# Patient Record
Sex: Female | Born: 1990 | Race: Black or African American | Hispanic: No | Marital: Single | Smoking: Former smoker
Health system: Southern US, Community
[De-identification: ages and names within clinical notes are randomized; demographics above are authoritative.]

## PROBLEM LIST (undated history)

## (undated) DIAGNOSIS — K219 Gastro-esophageal reflux disease without esophagitis: Secondary | ICD-10-CM

## (undated) DIAGNOSIS — N760 Acute vaginitis: Secondary | ICD-10-CM

## (undated) DIAGNOSIS — N390 Urinary tract infection, site not specified: Secondary | ICD-10-CM

## (undated) DIAGNOSIS — F419 Anxiety disorder, unspecified: Secondary | ICD-10-CM

## (undated) DIAGNOSIS — A749 Chlamydial infection, unspecified: Secondary | ICD-10-CM

## (undated) DIAGNOSIS — A549 Gonococcal infection, unspecified: Secondary | ICD-10-CM

## (undated) DIAGNOSIS — B9689 Other specified bacterial agents as the cause of diseases classified elsewhere: Secondary | ICD-10-CM

## (undated) HISTORY — PX: NO PAST SURGERIES: SHX2092

---
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
## 2012-03-27 ENCOUNTER — Emergency Department (HOSPITAL_COMMUNITY)
Admission: EM | Admit: 2012-03-27 | Discharge: 2012-03-27 | Disposition: A | Discharge status: Home / Self Care | Payer: Medicaid Other | Attending: Emergency Medicine | Admitting: Emergency Medicine

## 2012-03-27 ENCOUNTER — Encounter (HOSPITAL_COMMUNITY): Payer: Self-pay

## 2012-03-27 DIAGNOSIS — R109 Unspecified abdominal pain: Secondary | ICD-10-CM

## 2012-03-27 DIAGNOSIS — R1013 Epigastric pain: Secondary | ICD-10-CM | POA: Insufficient documentation

## 2012-03-27 DIAGNOSIS — R197 Diarrhea, unspecified: Secondary | ICD-10-CM | POA: Insufficient documentation

## 2012-03-27 DIAGNOSIS — Z3202 Encounter for pregnancy test, result negative: Secondary | ICD-10-CM | POA: Insufficient documentation

## 2012-03-27 DIAGNOSIS — F17200 Nicotine dependence, unspecified, uncomplicated: Secondary | ICD-10-CM | POA: Insufficient documentation

## 2012-03-27 DIAGNOSIS — R110 Nausea: Secondary | ICD-10-CM | POA: Insufficient documentation

## 2012-03-27 DIAGNOSIS — Z87448 Personal history of other diseases of urinary system: Secondary | ICD-10-CM | POA: Insufficient documentation

## 2012-03-27 DIAGNOSIS — Z87440 Personal history of urinary (tract) infections: Secondary | ICD-10-CM | POA: Insufficient documentation

## 2012-03-27 LAB — URINALYSIS, MICROSCOPIC ONLY
Bilirubin Urine: NEGATIVE
Glucose, UA: NEGATIVE mg/dL
Hgb urine dipstick: NEGATIVE
Ketones, ur: NEGATIVE mg/dL
Nitrite: NEGATIVE
Protein, ur: NEGATIVE mg/dL
Specific Gravity, Urine: 1.029 (ref 1.005–1.030)
Urobilinogen, UA: 1 mg/dL (ref 0.0–1.0)
pH: 6 (ref 5.0–8.0)

## 2012-03-27 LAB — CBC WITH DIFFERENTIAL/PLATELET
Basophils Absolute: 0 K/uL (ref 0.0–0.1)
Basophils Relative: 0 % (ref 0–1)
Eosinophils Absolute: 0.5 K/uL (ref 0.0–0.7)
Eosinophils Relative: 5 % (ref 0–5)
HCT: 40.3 % (ref 36.0–46.0)
Hemoglobin: 14 g/dL (ref 12.0–15.0)
Lymphocytes Relative: 33 % (ref 12–46)
Lymphs Abs: 3.6 K/uL (ref 0.7–4.0)
MCH: 28.5 pg (ref 26.0–34.0)
MCHC: 34.7 g/dL (ref 30.0–36.0)
MCV: 81.9 fL (ref 78.0–100.0)
Monocytes Absolute: 1.2 10*3/uL — ABNORMAL HIGH (ref 0.1–1.0)
Monocytes Relative: 11 % (ref 3–12)
Neutro Abs: 5.7 10*3/uL (ref 1.7–7.7)
Neutrophils Relative %: 52 % (ref 43–77)
Platelets: 364 K/uL (ref 150–400)
RBC: 4.92 MIL/uL (ref 3.87–5.11)
RDW: 13.9 % (ref 11.5–15.5)
WBC: 10.9 10*3/uL — ABNORMAL HIGH (ref 4.0–10.5)

## 2012-03-27 LAB — COMPREHENSIVE METABOLIC PANEL
AST: 19 U/L (ref 0–37)
BUN: 15 mg/dL (ref 6–23)
CO2: 26 mEq/L (ref 19–32)
Chloride: 102 mEq/L (ref 96–112)
Creatinine, Ser: 0.7 mg/dL (ref 0.50–1.10)
GFR calc non Af Amer: >90 mL/min (ref >90)
Total Bilirubin: 0.2 mg/dL — ABNORMAL LOW (ref 0.3–1.2)

## 2012-03-27 LAB — COMPREHENSIVE METABOLIC PANEL WITH GFR
ALT: 13 U/L (ref 0–35)
Albumin: 4.1 g/dL (ref 3.5–5.2)
Alkaline Phosphatase: 79 U/L (ref 39–117)
Calcium: 10.1 mg/dL (ref 8.4–10.5)
GFR calc Af Amer: >90 mL/min (ref >90)
Glucose, Bld: 85 mg/dL (ref 70–99)
Potassium: 3.9 meq/L (ref 3.5–5.1)
Sodium: 139 meq/L (ref 135–145)
Total Protein: 8.4 g/dL — ABNORMAL HIGH (ref 6.0–8.3)

## 2012-03-27 MED ORDER — PROMETHAZINE HCL 25 MG PO TABS: 25.0000 mg | ORAL_TABLET | Freq: Four times a day (QID) | ORAL | Status: DC | PRN

## 2012-03-27 MED ORDER — PANTOPRAZOLE SODIUM 40 MG PO TBEC: 40.0000 mg | DELAYED_RELEASE_TABLET | Freq: Every day | ORAL | Status: DC

## 2012-03-27 MED ORDER — GI COCKTAIL ~~LOC~~
30.0000 mL | Freq: Once | ORAL | Status: AC
  Administered 2012-03-27: 30 mL via ORAL
  Filled 2012-03-27: qty 30

## 2012-03-27 NOTE — ED Provider Notes (Signed)
History     CSN: [id number]  Arrival date & time 03/27/12  1536   First MD Initiated Contact with Patient 03/27/12 1804      Chief Complaint  Patient presents with  . Abdominal Pain  . Nausea    (Consider location/radiation/quality/duration/timing/severity/associated sxs/prior treatment) HPI Comments: Patient reports that she's had chronic problems with her stomach reporting intermittent upper epigastric pain that is sometimes sharp, sometimes crampy in nature. She reports she's had more frequent episodes and more chronic episodes daily over the last several weeks associated with nausea and occasional loose stool. She denies actual vomiting and no blood in her stool or hematemesis. She denies any dysuria, urinary frequency. She denies any new.neurologic symptoms. She has not taken any medications for the symptoms. She reports she has not taken x4 other antacids. She has seen a family Dr., Dr. Ronne Binning in the past was given some Vicodin for this pain. She denies fever or chills. She denies any previous abdominal surgeries. She does not have a primary care physician but does have Medicaid.  She reports pain is worse across upper epigastrium first thing in the AM.  Gets better through the day.  Not worse with eating.    Patient is a 22 y.o. female presenting with abdominal pain. The history is provided by the patient.  Abdominal Pain Associated symptoms: diarrhea and nausea   Associated symptoms: no chest pain, no chills, no dysuria, no fever, no shortness of breath, no vaginal bleeding, no vaginal discharge and no vomiting     Past Medical History  Diagnosis Date  . UTI (lower urinary tract infection)   . BV (bacterial vaginosis)     History reviewed. No pertinent past surgical history.  History reviewed. No pertinent family history.  History  Substance Use Topics  . Smoking status: Current Every Day Smoker    Types: Cigars  . Smokeless tobacco: Not on file  . Alcohol Use: No     OB History   Grav Para Term Preterm Abortions TAB SAB Ect Mult Living                  Review of Systems  Constitutional: Negative for fever, chills and appetite change.  Respiratory: Negative for shortness of breath.   Cardiovascular: Negative for chest pain.  Gastrointestinal: Positive for nausea, abdominal pain and diarrhea. Negative for vomiting and blood in stool.  Genitourinary: Negative for dysuria, vaginal bleeding and vaginal discharge.  Musculoskeletal: Negative for back pain.  All other systems reviewed and are negative.    Allergies  Pineapple  Home Medications   Current Outpatient Rx  Name  Route  Sig  Dispense  Refill  . pantoprazole (PROTONIX) 40 MG tablet   Oral   Take 1 tablet (40 mg total) by mouth daily.   14 tablet   0   . promethazine (PHENERGAN) 25 MG tablet   Oral   Take 1 tablet (25 mg total) by mouth every 6 (six) hours as needed for nausea.   20 tablet   0     BP 120/79  Pulse 96  Temp(Src) 97.8 F (36.6 C) (Oral)  Resp 18  SpO2 99%  LMP 03/11/2012  Physical Exam  Nursing note and vitals reviewed. Constitutional: She is oriented to person, place, and time. She appears well-developed and well-nourished.  HENT:  Head: Normocephalic and atraumatic.  Eyes: Pupils are equal, round, and reactive to light. No scleral icterus.  Neck: Normal range of motion. Neck supple.  Cardiovascular: Normal  rate and intact distal pulses.   Pulmonary/Chest: Effort normal. No respiratory distress. She has no wheezes.  Abdominal: Soft. She exhibits no distension. There is no tenderness. There is no rebound and no guarding.  Neurological: She is alert and oriented to person, place, and time. Coordination and gait normal.  Skin: Skin is warm and dry.  Psychiatric: She has a normal mood and affect.    ED Course  Procedures (including critical care time)  Labs Reviewed  CBC WITH DIFFERENTIAL - Abnormal; Notable for the following:    WBC 10.9 (*)     Monocytes Absolute 1.2 (*)    All other components within normal limits  COMPREHENSIVE METABOLIC PANEL - Abnormal; Notable for the following:    Total Protein 8.4 (*)    Total Bilirubin 0.2 (*)    All other components within normal limits  URINALYSIS, MICROSCOPIC ONLY - Abnormal; Notable for the following:    Leukocytes, UA SMALL (*)    Bacteria, UA FEW (*)    Squamous Epithelial / LPF MANY (*)    All other components within normal limits  URINE CULTURE   No results found.   1. Abdominal pain     ra sat is 98% and I interpret to be normal.    6:59 PM Urine hCG Medicare test was reportedly negative. It is crossing over an electronic system.  MDM  Pt with soft abdomen, no surgical signs.  Pt's vitals are normal.  Pt's history suggests more of a chronic issue and needs outpt referrals to PCP and/or GI.  Will put on protonix.          Gavin Pound. Oletta Lamas, MD 03/27/12 1859

## 2012-03-27 NOTE — ED Notes (Signed)
Pt here for general abd pain and nausea and diarrhea. Denies any vomiting. States this has been bothering her for 2-3 weeks. Last episode of diarrhea was 2 days ago. Has been eating and drinking okay except for today. Reports decrease in appetite today. Denies any vaginal bleeding or discharge or urinary symptoms.

## 2012-03-27 NOTE — Discharge Instructions (Signed)
Abdominal Pain  Abdominal pain can be caused by many things. Your caregiver decides the seriousness of your pain by an examination and possibly blood tests and X-rays. Many cases can be observed and treated at home. Most abdominal pain is not caused by a disease and will probably improve without treatment. However, in many cases, more time must pass before a clear cause of the pain can be found. Before that point, it may not be known if you need more testing, or if hospitalization or surgery is needed.  HOME CARE INSTRUCTIONS   · Do not take laxatives unless directed by your caregiver.  · Take pain medicine only as directed by your caregiver.  · Only take over-the-counter or prescription medicines for pain, discomfort, or fever as directed by your caregiver.  · Try a clear liquid diet (broth, tea, or water) for as long as directed by your caregiver. Slowly move to a bland diet as tolerated.  SEEK IMMEDIATE MEDICAL CARE IF:   · The pain does not go away.  · You have a fever.  · You keep throwing up (vomiting).  · The pain is felt only in portions of the abdomen. Pain in the right side could possibly be appendicitis. In an adult, pain in the left lower portion of the abdomen could be colitis or diverticulitis.  · You pass bloody or black tarry stools.  MAKE SURE YOU:   · Understand these instructions.  · Will watch your condition.  · Will get help right away if you are not doing well or get worse.  Document Released: 10/18/2004 Document Revised: 04/02/2011 Document Reviewed: 08/27/2007  [employer]® Patient Information ©2013 [employer].

## 2012-03-28 LAB — POCT PREGNANCY, URINE: Preg Test, Ur: NEGATIVE

## 2012-03-29 LAB — URINE CULTURE: Colony Count: 35000

## 2012-06-03 ENCOUNTER — Inpatient Hospital Stay (HOSPITAL_COMMUNITY)
Admission: AD | Admit: 2012-06-03 | Discharge: 2012-06-03 | Disposition: A | Discharge status: Home / Self Care | Payer: Medicaid Other | Source: Ambulatory Visit | Attending: Obstetrics & Gynecology | Admitting: Obstetrics & Gynecology

## 2012-06-03 ENCOUNTER — Encounter (HOSPITAL_COMMUNITY): Payer: Self-pay | Admitting: *Deleted

## 2012-06-03 DIAGNOSIS — B3731 Acute candidiasis of vulva and vagina: Secondary | ICD-10-CM

## 2012-06-03 DIAGNOSIS — N912 Amenorrhea, unspecified: Secondary | ICD-10-CM | POA: Insufficient documentation

## 2012-06-03 DIAGNOSIS — B373 Candidiasis of vulva and vagina: Secondary | ICD-10-CM

## 2012-06-03 HISTORY — DX: Gastro-esophageal reflux disease without esophagitis: K21.9

## 2012-06-03 LAB — URINALYSIS, ROUTINE W REFLEX MICROSCOPIC
Bilirubin Urine: NEGATIVE
Glucose, UA: NEGATIVE mg/dL
Hgb urine dipstick: NEGATIVE
Ketones, ur: NEGATIVE mg/dL
Protein, ur: NEGATIVE mg/dL

## 2012-06-03 LAB — URINE MICROSCOPIC-ADD ON

## 2012-06-03 LAB — WET PREP, GENITAL: Trich, Wet Prep: NONE SEEN

## 2012-06-03 MED ORDER — FLUCONAZOLE 150 MG PO TABS: 150.0000 mg | ORAL_TABLET | Freq: Once | ORAL | Status: DC

## 2012-06-03 MED ORDER — IBUPROFEN 400 MG PO TABS
400.0000 mg | ORAL_TABLET | Freq: Once | ORAL | Status: AC
  Administered 2012-06-03: 400 mg via ORAL
  Filled 2012-06-03: qty 1

## 2012-06-03 NOTE — MAU Note (Signed)
Think I am pregnant, has not done a home test

## 2012-06-03 NOTE — MAU Provider Note (Signed)
History     CSN: [id number]  Arrival date and time: 06/03/12 1254   None     No chief complaint on file.  HPI Pt is not pregnant but missed her period and concerned that she might be pregnant. Her LMP was 04/15/2012. Pt denies spotting or bleeding or vaginal discharge.  Pt has hx of irregular periods.  Past Medical History  Diagnosis Date  . UTI (lower urinary tract infection)   . BV (bacterial vaginosis)   . GERD (gastroesophageal reflux disease)     Past Surgical History  Procedure Laterality Date  . No past surgeries      History reviewed. No pertinent family history.  History  Substance Use Topics  . Smoking status: Current Every Day Smoker    Types: Cigars  . Smokeless tobacco: Not on file  . Alcohol Use: No    Allergies:  Allergies  Allergen Reactions  . Pineapple Swelling    Tongue swelling    No prescriptions prior to admission    ROS Physical Exam   Blood pressure 117/81, pulse 79, temperature 98.1 F (36.7 C), temperature source Oral, resp. rate 18, height 5' 2.5" (1.588 m), weight 78.472 kg (173 lb), last menstrual period 04/15/2012.  Physical Exam  Nursing note and vitals reviewed. Constitutional: She is oriented to person, place, and time. She appears well-developed and well-nourished.  HENT:  Head: Normocephalic.  Eyes: Pupils are equal, round, and reactive to light.  Neck: Normal range of motion. Neck supple.  Cardiovascular: Normal rate.   Respiratory: Effort normal.  GI: Soft. She exhibits no distension. There is no tenderness. There is no rebound and no guarding.  Genitourinary:  Vaginal mucosa reddened; mod amount of thick curd type white discharge in vault; cervix clean, NT; uterus NSSC NT; adnexal without palpable enlargement or tenderness  Musculoskeletal: Normal range of motion.  Neurological: She is alert and oriented to person, place, and time.  Skin: Skin is warm and dry.  Psychiatric: She has a normal mood and affect.     MAU Course  Procedures Results for orders placed during the hospital encounter of 06/03/12 (from the past 24 hour(s))  URINALYSIS, ROUTINE W REFLEX MICROSCOPIC     Status: Abnormal   Collection Time    06/03/12  1:30 PM      Result Value Range   Color, Urine YELLOW  YELLOW   APPearance CLEAR  CLEAR   Specific Gravity, Urine >1.030 (*) 1.005 - 1.030   pH 6.0  5.0 - 8.0   Glucose, UA NEGATIVE  NEGATIVE mg/dL   Hgb urine dipstick NEGATIVE  NEGATIVE   Bilirubin Urine NEGATIVE  NEGATIVE   Ketones, ur NEGATIVE  NEGATIVE mg/dL   Protein, ur NEGATIVE  NEGATIVE mg/dL   Urobilinogen, UA 0.2  0.0 - 1.0 mg/dL   Nitrite NEGATIVE  NEGATIVE   Leukocytes, UA TRACE (*) NEGATIVE  URINE MICROSCOPIC-ADD ON     Status: None   Collection Time    06/03/12  1:30 PM      Result Value Range   Squamous Epithelial / LPF RARE  RARE   WBC, UA 0-2  <3 WBC/hpf   RBC / HPF 0-2  <3 RBC/hpf   Bacteria, UA RARE  RARE   Urine-Other MUCOUS PRESENT    POCT PREGNANCY, URINE     Status: None   Collection Time    06/03/12  1:39 PM      Result Value Range   Preg Test, Ur NEGATIVE  NEGATIVE  WET PREP, GENITAL     Status: Abnormal   Collection Time    06/03/12  3:10 PM      Result Value Range   Yeast Wet Prep HPF POC MODERATE (*) NONE SEEN   Trich, Wet Prep NONE SEEN  NONE SEEN   Clue Cells Wet Prep HPF POC NONE SEEN  NONE SEEN   WBC, Wet Prep HPF POC MODERATE (*) NONE SEEN    Assessment and Plan  Yeast vaginitis- Diflucan 150#2 one po now and repeat 72 hours Amenorrhea- f/u in GYN clinic for persistant amenorrhea  Ger Ringenberg 06/03/2012, 3:42 PM

## 2012-06-09 ENCOUNTER — Ambulatory Visit (INDEPENDENT_AMBULATORY_CARE_PROVIDER_SITE_OTHER): Payer: Medicaid Other | Admitting: General Practice

## 2012-06-09 VITALS — BP 128/77 | HR 76 | Temp 97.8°F | Ht 62.0 in | Wt 171.2 lb

## 2012-06-09 DIAGNOSIS — A749 Chlamydial infection, unspecified: Secondary | ICD-10-CM

## 2012-06-09 DIAGNOSIS — A549 Gonococcal infection, unspecified: Secondary | ICD-10-CM

## 2012-06-09 DIAGNOSIS — A5400 Gonococcal infection of lower genitourinary tract, unspecified: Secondary | ICD-10-CM

## 2012-06-09 MED ORDER — CEFTRIAXONE SODIUM 1 G IJ SOLR
250.0000 mg | Freq: Once | INTRAMUSCULAR | Status: AC
  Administered 2012-06-09: 250 mg via INTRAMUSCULAR

## 2012-06-09 MED ORDER — AZITHROMYCIN 1 G PO PACK
1.0000 g | PACK | Freq: Once | ORAL | Status: AC
  Administered 2012-06-09: 1 g via ORAL

## 2012-09-01 ENCOUNTER — Encounter (HOSPITAL_COMMUNITY): Payer: Self-pay | Admitting: *Deleted

## 2012-09-01 ENCOUNTER — Emergency Department (HOSPITAL_COMMUNITY)
Admission: EM | Admit: 2012-09-01 | Discharge: 2012-09-01 | Disposition: A | Discharge status: Home / Self Care | Payer: Medicaid Other | Attending: Emergency Medicine | Admitting: Emergency Medicine

## 2012-09-01 DIAGNOSIS — R197 Diarrhea, unspecified: Secondary | ICD-10-CM | POA: Insufficient documentation

## 2012-09-01 DIAGNOSIS — Z8719 Personal history of other diseases of the digestive system: Secondary | ICD-10-CM | POA: Insufficient documentation

## 2012-09-01 DIAGNOSIS — R109 Unspecified abdominal pain: Secondary | ICD-10-CM | POA: Insufficient documentation

## 2012-09-01 DIAGNOSIS — F17200 Nicotine dependence, unspecified, uncomplicated: Secondary | ICD-10-CM | POA: Insufficient documentation

## 2012-09-01 DIAGNOSIS — R110 Nausea: Secondary | ICD-10-CM | POA: Insufficient documentation

## 2012-09-01 DIAGNOSIS — N949 Unspecified condition associated with female genital organs and menstrual cycle: Secondary | ICD-10-CM | POA: Insufficient documentation

## 2012-09-01 DIAGNOSIS — A499 Bacterial infection, unspecified: Secondary | ICD-10-CM | POA: Insufficient documentation

## 2012-09-01 DIAGNOSIS — B9689 Other specified bacterial agents as the cause of diseases classified elsewhere: Secondary | ICD-10-CM | POA: Insufficient documentation

## 2012-09-01 DIAGNOSIS — Z3202 Encounter for pregnancy test, result negative: Secondary | ICD-10-CM | POA: Insufficient documentation

## 2012-09-01 DIAGNOSIS — N390 Urinary tract infection, site not specified: Secondary | ICD-10-CM | POA: Insufficient documentation

## 2012-09-01 DIAGNOSIS — Z792 Long term (current) use of antibiotics: Secondary | ICD-10-CM | POA: Insufficient documentation

## 2012-09-01 DIAGNOSIS — N898 Other specified noninflammatory disorders of vagina: Secondary | ICD-10-CM | POA: Insufficient documentation

## 2012-09-01 DIAGNOSIS — N760 Acute vaginitis: Secondary | ICD-10-CM | POA: Insufficient documentation

## 2012-09-01 LAB — URINE MICROSCOPIC-ADD ON

## 2012-09-01 LAB — URINALYSIS, ROUTINE W REFLEX MICROSCOPIC
Bilirubin Urine: NEGATIVE
Specific Gravity, Urine: 1.027 (ref 1.005–1.030)
pH: 8 (ref 5.0–8.0)

## 2012-09-01 LAB — WET PREP, GENITAL
Trich, Wet Prep: NONE SEEN
Yeast Wet Prep HPF POC: NONE SEEN

## 2012-09-01 LAB — POCT PREGNANCY, URINE: Preg Test, Ur: NEGATIVE

## 2012-09-01 MED ORDER — METRONIDAZOLE 500 MG PO TABS: 500.0000 mg | ORAL_TABLET | Freq: Two times a day (BID) | ORAL | Status: DC

## 2012-09-01 MED ORDER — CIPROFLOXACIN HCL 500 MG PO TABS: 500.0000 mg | ORAL_TABLET | Freq: Two times a day (BID) | ORAL | Status: DC

## 2012-09-01 NOTE — ED Notes (Signed)
Patient reports "I think I have a UTI" c/o dysuria x 1 day that radiates to her back. Also has some mild abd pain x 1 month after starting Depo injections. LMP March. Cycles are irregular. Denies hematuria. No vaginal discharge.

## 2012-09-01 NOTE — ED Provider Notes (Signed)
CSN: [id number]     Arrival date & time 09/01/12  1401 History     First MD Initiated Contact with Patient 09/01/12 1517     Chief Complaint  Patient presents with  . Dysuria   (Consider location/radiation/quality/duration/timing/severity/associated sxs/prior Treatment) HPI Pt is a 22yo female with hx of UTI presenting with dysuria that started yesterday associated with abdominal pain described as sharp and cramping, radiation to her back, 7/10.  Has not tried anything for pain.  Reports some nausea and 3 episodes of diarrhea without blood or mucous.  Pt states she feels like this every time she has a UTI.  Denies fever.  Denies vaginal discharge but does report some lower abdominal and vaginal pain.  Denies previous abdominal surgeries or kidney problems. Last UTI was 2-81mo ago.  Pt states she started Depo last month but has not had a menstrual cycle since March 2014.  Pt states she is not concerned for STDs but still would like pelvic exam.    Past Medical History  Diagnosis Date  . UTI (lower urinary tract infection)   . BV (bacterial vaginosis)   . GERD (gastroesophageal reflux disease)    Past Surgical History  Procedure Laterality Date  . No past surgeries     No family history on file. History  Substance Use Topics  . Smoking status: Current Every Day Smoker    Types: Cigars  . Smokeless tobacco: Not on file  . Alcohol Use: No   OB History   Grav Para Term Preterm Abortions TAB SAB Ect Mult Living   0              Review of Systems  Constitutional: Negative for fever and chills.  Gastrointestinal: Positive for nausea, abdominal pain and diarrhea. Negative for vomiting.  Genitourinary: Positive for dysuria, urgency, frequency, flank pain, vaginal pain and pelvic pain. Negative for vaginal bleeding and vaginal discharge.  All other systems reviewed and are negative.    Allergies  Pineapple  Home Medications   Current Outpatient Rx  Name  Route  Sig  Dispense   Refill  . ciprofloxacin (CIPRO) 500 MG tablet   Oral   Take 1 tablet (500 mg total) by mouth 2 (two) times daily.   6 tablet   0   . metroNIDAZOLE (FLAGYL) 500 MG tablet   Oral   Take 1 tablet (500 mg total) by mouth 2 (two) times daily.   14 tablet   0    BP 111/73  Pulse 74  Temp(Src) 99.3 F (37.4 C) (Oral)  Resp 14  SpO2 100%  LMP 04/01/2012 Physical Exam  Nursing note and vitals reviewed. Constitutional: She appears well-developed and well-nourished. No distress.  Pt sitting comfortably in exam bed, NAD.     HENT:  Head: Normocephalic and atraumatic.  Eyes: Conjunctivae are normal. No scleral icterus.  Neck: Normal range of motion.  Cardiovascular: Normal rate, regular rhythm and normal heart sounds.   Pulmonary/Chest: Effort normal and breath sounds normal. No respiratory distress. She has no wheezes. She has no rales. She exhibits no tenderness.  Abdominal: Soft. Bowel sounds are normal. She exhibits no distension and no mass. There is tenderness ( mild, lower abdomen ). There is no rebound, no guarding and no CVA tenderness.  No CVAT  Genitourinary: Vagina normal and uterus normal. No labial fusion. There is no rash, tenderness, lesion or injury on the right labia. There is no rash, tenderness, lesion or injury on the left labia. Cervix  exhibits discharge (scant clear to yellow discharge ). Cervix exhibits no motion tenderness and no friability. Right adnexum displays no mass, no tenderness and no fullness. Left adnexum displays no mass, no tenderness and no fullness. No erythema, tenderness or bleeding around the vagina. No foreign body around the vagina. No signs of injury around the vagina. No vaginal discharge found.  Musculoskeletal: Normal range of motion.  Neurological: She is alert.  Skin: Skin is warm and dry. She is not diaphoretic.    ED Course   Procedures (including critical care time)  Labs Reviewed  WET PREP, GENITAL - Abnormal; Notable for the  following:    Clue Cells Wet Prep HPF POC FEW (*)    WBC, Wet Prep HPF POC MODERATE (*)    All other components within normal limits  URINALYSIS, ROUTINE W REFLEX MICROSCOPIC - Abnormal; Notable for the following:    APPearance CLOUDY (*)    Hgb urine dipstick LARGE (*)    Protein, ur 100 (*)    Leukocytes, UA LARGE (*)    All other components within normal limits  URINE MICROSCOPIC-ADD ON - Abnormal; Notable for the following:    Squamous Epithelial / LPF MANY (*)    Bacteria, UA FEW (*)    All other components within normal limits  URINE CULTURE  GC/CHLAMYDIA PROBE AMP  POCT PREGNANCY, URINE   No results found. 1. UTI (lower urinary tract infection)   2. BV (bacterial vaginosis)     MDM  Pt likely has UTI due to PMH and symptoms.  Will also perform pelvic exam.  Pt is afebrile and appears well at this time.  Not concerned for pyelonephritis at this time.    UA: evidence of UTI Wet prep: few clue cells, will tx  Rx: cirpo and flagyl. Will discharge pt home and have her f/u with [facility] info provided. Return precautions given. Pt verbalized understanding and agreement with tx plan. Vitals: unremarkable. Discharged in stable condition.         Junius Finner, PA-C 09/01/12 1734

## 2012-09-01 NOTE — ED Notes (Signed)
PA at bedside.

## 2012-09-01 NOTE — ED Notes (Signed)
Pt with hx of frequent UTI's to ED c/o stinging with urination and diffuse abd pain and back pain. Denies vaginal discharge.

## 2012-09-02 LAB — GC/CHLAMYDIA PROBE AMP
CT Probe RNA: NEGATIVE
GC Probe RNA: NEGATIVE

## 2012-09-02 LAB — URINE CULTURE

## 2012-09-05 NOTE — ED Provider Notes (Signed)
Medical screening examination/treatment/procedure(s) were performed by non-physician practitioner and as supervising physician I was immediately available for consultation/collaboration.   Katherine Doane Joseph Charlynn Salih, MD 09/05/12 1051 

## 2012-10-29 ENCOUNTER — Emergency Department (HOSPITAL_COMMUNITY)
Admission: EM | Admit: 2012-10-29 | Discharge: 2012-10-30 | Disposition: A | Discharge status: Home / Self Care | Payer: Medicaid Other | Attending: Emergency Medicine | Admitting: Emergency Medicine

## 2012-10-29 ENCOUNTER — Encounter (HOSPITAL_COMMUNITY): Payer: Self-pay | Admitting: Emergency Medicine

## 2012-10-29 DIAGNOSIS — Z8719 Personal history of other diseases of the digestive system: Secondary | ICD-10-CM | POA: Insufficient documentation

## 2012-10-29 DIAGNOSIS — Z3202 Encounter for pregnancy test, result negative: Secondary | ICD-10-CM | POA: Insufficient documentation

## 2012-10-29 DIAGNOSIS — N390 Urinary tract infection, site not specified: Secondary | ICD-10-CM | POA: Insufficient documentation

## 2012-10-29 DIAGNOSIS — F17200 Nicotine dependence, unspecified, uncomplicated: Secondary | ICD-10-CM | POA: Insufficient documentation

## 2012-10-29 DIAGNOSIS — A499 Bacterial infection, unspecified: Secondary | ICD-10-CM | POA: Insufficient documentation

## 2012-10-29 DIAGNOSIS — N760 Acute vaginitis: Secondary | ICD-10-CM | POA: Insufficient documentation

## 2012-10-29 DIAGNOSIS — B9689 Other specified bacterial agents as the cause of diseases classified elsewhere: Secondary | ICD-10-CM | POA: Insufficient documentation

## 2012-10-29 LAB — URINE MICROSCOPIC-ADD ON

## 2012-10-29 LAB — URINALYSIS, ROUTINE W REFLEX MICROSCOPIC
Ketones, ur: 15 mg/dL — AB
Nitrite: POSITIVE — AB
Protein, ur: >300 mg/dL — AB
pH: 5 (ref 5.0–8.0)

## 2012-10-29 NOTE — ED Notes (Addendum)
Wet prep and GC/Chlamydia collected by Magnus Sinning, PA

## 2012-10-29 NOTE — ED Provider Notes (Signed)
CSN: [id number]     Arrival date & time 10/29/12  2016 History   First MD Initiated Contact with Patient 10/29/12 2155     Chief Complaint  Patient presents with  . Dysuria  . Hematuria   (Consider location/radiation/quality/duration/timing/severity/associated sxs/prior Treatment) HPI Comments: Patient presents today with a chief complaint of dysuria and hematuria.  She reports that her symptoms began earlier today and have gradually been worsening.  She also reports increased urinary frequency and urgency.  She has not taken anything for her symptoms prior to arrival.  She denies flank pain.  Denies fever or chills.  Denies nausea or vomiting.  She is also requesting to be tested for STD's.  She is currently sexually active.  She denies vaginal discharge.  LMP was three months ago.  She is currently on Depo Provera.    The history is provided by the patient.    Past Medical History  Diagnosis Date  . UTI (lower urinary tract infection)   . BV (bacterial vaginosis)   . GERD (gastroesophageal reflux disease)    Past Surgical History  Procedure Laterality Date  . No past surgeries     No family history on file. History  Substance Use Topics  . Smoking status: Current Every Day Smoker    Types: Cigars  . Smokeless tobacco: Not on file  . Alcohol Use: No   OB History   Grav Para Term Preterm Abortions TAB SAB Ect Mult Living   0              Review of Systems  All other systems reviewed and are negative.    Allergies  Pineapple  Home Medications  No current outpatient prescriptions on file. BP 106/62  Pulse 77  Temp(Src) 98.3 F (36.8 C) (Oral)  Resp 20  Wt 177 lb 9 oz (80.542 kg)  BMI 32.47 kg/m2  SpO2 100% Physical Exam  Nursing note and vitals reviewed. Constitutional: She appears well-developed and well-nourished.  HENT:  Head: Normocephalic and atraumatic.  Mouth/Throat: Oropharynx is clear and moist.  Neck: Normal range of motion. Neck supple.   Cardiovascular: Normal rate, regular rhythm and normal heart sounds.   Pulmonary/Chest: Effort normal and breath sounds normal.  Abdominal: Soft. Bowel sounds are normal. She exhibits no distension and no mass. There is no rebound, no guarding and no CVA tenderness.  Mild suprapubic abdominal pain  Genitourinary: Vagina normal. Cervix exhibits no motion tenderness. Right adnexum displays no mass, no tenderness and no fullness. Left adnexum displays no mass, no tenderness and no fullness.  Thick whitish colored discharge in the vaginal vault  Musculoskeletal: Normal range of motion.  Neurological: She is alert.  Skin: Skin is warm and dry.  Psychiatric: She has a normal mood and affect.    ED Course  Procedures (including critical care time) Labs Review Labs Reviewed  URINALYSIS, ROUTINE W REFLEX MICROSCOPIC - Abnormal; Notable for the following:    Color, Urine RED (*)    APPearance TURBID (*)    Hgb urine dipstick LARGE (*)    Bilirubin Urine MODERATE (*)    Ketones, ur 15 (*)    Protein, ur >300 (*)    Nitrite POSITIVE (*)    Leukocytes, UA LARGE (*)    All other components within normal limits  URINE MICROSCOPIC-ADD ON - Abnormal; Notable for the following:    Squamous Epithelial / LPF FEW (*)    Bacteria, UA FEW (*)    All other components within normal  limits  URINE CULTURE  GC/CHLAMYDIA PROBE AMP  WET PREP, GENITAL  POCT PREGNANCY, URINE   Imaging Review No results found.  MDM  No diagnosis found. Patient presenting with dysuria and hematuria.  UA showing UTI.  Patient denies fever, chills, nausea, vomiting, or flank pain.  No CVA tenderness on exam.  Therefore, doubt kidney stone or Pyelonephritis.  Patient treated with Macrobid.  Urine cultured.  Patient also requesting testing for STD's.  Wet prep showing BV.  Patient started on Flagyl.  GC/Chlamydia pending.  Patient stable for discharge.    Pascal Lux [place], PA-C 10/30/12 1223

## 2012-10-29 NOTE — ED Notes (Signed)
Pt. reports dysuria /hematuria onset this morning , denies injury , no fever or chills , slight vaginal/low abdominal cramping.

## 2012-10-30 LAB — WET PREP, GENITAL: Yeast Wet Prep HPF POC: NONE SEEN

## 2012-10-30 LAB — URINE CULTURE: Colony Count: >=100000

## 2012-10-30 MED ORDER — NITROFURANTOIN MONOHYD MACRO 100 MG PO CAPS: 100.0000 mg | ORAL_CAPSULE | Freq: Two times a day (BID) | ORAL | Status: DC

## 2012-10-30 MED ORDER — METRONIDAZOLE 500 MG PO TABS: 500.0000 mg | ORAL_TABLET | Freq: Two times a day (BID) | ORAL | Status: DC

## 2012-10-31 LAB — GC/CHLAMYDIA PROBE AMP: GC Probe RNA: POSITIVE — AB

## 2012-10-31 NOTE — ED Provider Notes (Signed)
Medical screening examination/treatment/procedure(s) were performed by non-physician practitioner and as supervising physician I was immediately available for consultation/collaboration.   Charles B. Sheldon, MD 10/31/12 2028 

## 2012-11-01 ENCOUNTER — Telehealth (HOSPITAL_COMMUNITY): Payer: Self-pay | Admitting: Emergency Medicine

## 2012-11-01 NOTE — ED Notes (Signed)
+  Chlamydia. +Gonorrhea. Chart sent to EDP office for review. 

## 2012-11-01 NOTE — ED Notes (Signed)
Patient has +Chlamydia and +Gonorrhea. °

## 2012-11-06 NOTE — ED Notes (Signed)
Chart returned from EDP office  Azithromycin 1000 mg po x1 Cefpodoxime 400 mg po x 1 Inform patient,should f/u with Health Dept.

## 2012-11-08 ENCOUNTER — Telehealth (HOSPITAL_COMMUNITY): Payer: Self-pay | Admitting: Emergency Medicine

## 2012-11-12 NOTE — ED Notes (Signed)
Patient informed of positive results after id'd x 2 and informed of need to notify partner to be treated.rx called to pharmacy by Marisue Ivan PFM

## 2012-11-25 ENCOUNTER — Emergency Department (HOSPITAL_COMMUNITY)
Admission: EM | Admit: 2012-11-25 | Discharge: 2012-11-26 | Disposition: A | Discharge status: Home / Self Care | Payer: Medicaid Other | Attending: Emergency Medicine | Admitting: Emergency Medicine

## 2012-11-25 ENCOUNTER — Encounter (HOSPITAL_COMMUNITY): Payer: Self-pay | Admitting: Emergency Medicine

## 2012-11-25 DIAGNOSIS — F17200 Nicotine dependence, unspecified, uncomplicated: Secondary | ICD-10-CM | POA: Insufficient documentation

## 2012-11-25 DIAGNOSIS — Z3202 Encounter for pregnancy test, result negative: Secondary | ICD-10-CM | POA: Insufficient documentation

## 2012-11-25 DIAGNOSIS — A499 Bacterial infection, unspecified: Secondary | ICD-10-CM | POA: Insufficient documentation

## 2012-11-25 DIAGNOSIS — N760 Acute vaginitis: Secondary | ICD-10-CM | POA: Insufficient documentation

## 2012-11-25 DIAGNOSIS — Z87440 Personal history of urinary (tract) infections: Secondary | ICD-10-CM | POA: Insufficient documentation

## 2012-11-25 DIAGNOSIS — B9689 Other specified bacterial agents as the cause of diseases classified elsewhere: Secondary | ICD-10-CM | POA: Insufficient documentation

## 2012-11-25 DIAGNOSIS — Z202 Contact with and (suspected) exposure to infections with a predominantly sexual mode of transmission: Secondary | ICD-10-CM | POA: Insufficient documentation

## 2012-11-25 DIAGNOSIS — Z8719 Personal history of other diseases of the digestive system: Secondary | ICD-10-CM | POA: Insufficient documentation

## 2012-11-25 MED ORDER — PENICILLIN G BENZATHINE 1200000 UNIT/2ML IM SUSP
1.2000 10*6.[IU] | Freq: Once | INTRAMUSCULAR | Status: AC
  Administered 2012-11-26: 1.2 10*6.[IU] via INTRAMUSCULAR
  Filled 2012-11-25: qty 2

## 2012-11-25 NOTE — ED Notes (Signed)
Pt. advised by health dept. to have a STD check due to sexual encounter with an individual positive for STD . Denies any urinary or genital symptoms or discomfort .

## 2012-11-25 NOTE — ED Notes (Signed)
MD at bedside. 

## 2012-11-25 NOTE — ED Notes (Signed)
Patient is resting comfortably. 

## 2012-11-25 NOTE — ED Provider Notes (Signed)
CSN: [id number]     Arrival date & time 11/25/12  2117 History   First MD Initiated Contact with Patient 11/25/12 2347     Chief Complaint  Patient presents with  . Exposure to STD   (Consider location/radiation/quality/duration/timing/severity/associated sxs/prior Treatment) HPI Comments: Patient is a 22 year old female with a past medical history of gonorrhea and chlamydia who presents to the emergency department requesting to be checked for sexually transmitted diseases. Patient states her partner was recently told he tested positive for syphilis and was advised to go for treatment. Currently patient is without any complaints, denies abdominal pain, fever, chills, vaginal bleeding, discharge or pain. Admits to being sexually active with a new partner, states she uses protection and practices oral sex. She was recently treated for both gonorrhea and Chlamydia. LMP June or July, recently came off birth control.  Patient is a 22 y.o. female presenting with STD exposure. The history is provided by the patient.  Exposure to STD    Past Medical History  Diagnosis Date  . UTI (lower urinary tract infection)   . BV (bacterial vaginosis)   . GERD (gastroesophageal reflux disease)    Past Surgical History  Procedure Laterality Date  . No past surgeries     No family history on file. History  Substance Use Topics  . Smoking status: Current Every Day Smoker    Types: Cigars  . Smokeless tobacco: Not on file  . Alcohol Use: No   OB History   Grav Para Term Preterm Abortions TAB SAB Ect Mult Living   0              Review of Systems  All other systems reviewed and are negative.    Allergies  Pineapple  Home Medications  No current outpatient prescriptions on file. BP 126/81  Pulse 91  Temp(Src) 97.4 F (36.3 C) (Oral)  Resp 18  SpO2 99% Physical Exam  Nursing note and vitals reviewed. Constitutional: She is oriented to person, place, and time. She appears well-developed  and well-nourished. No distress.  HENT:  Head: Normocephalic and atraumatic.  Mouth/Throat: Oropharynx is clear and moist.  Eyes: Conjunctivae and EOM are normal.  Neck: Normal range of motion. Neck supple.  Cardiovascular: Normal rate, regular rhythm and normal heart sounds.   Pulmonary/Chest: Effort normal and breath sounds normal.  Abdominal: Soft. Bowel sounds are normal. There is no tenderness.  Genitourinary: Uterus normal. Cervix exhibits no motion tenderness, no discharge and no friability. Right adnexum displays no mass, no tenderness and no fullness. Left adnexum displays no mass, no tenderness and no fullness. No erythema, tenderness or bleeding around the vagina. Vaginal discharge (malodorous, white) found.  Musculoskeletal: Normal range of motion. She exhibits no edema.  Neurological: She is alert and oriented to person, place, and time.  Skin: Skin is warm and dry. She is not diaphoretic.  Psychiatric: She has a normal mood and affect. Her behavior is normal.    ED Course  Procedures (including critical care time) Labs Review Labs Reviewed  WET PREP, GENITAL - Abnormal; Notable for the following:    Clue Cells Wet Prep HPF POC FEW (*)    WBC, Wet Prep HPF POC MANY (*)    All other components within normal limits  GC/CHLAMYDIA PROBE AMP  RPR  HIV ANTIBODY (ROUTINE TESTING)  POCT PREGNANCY, URINE   Imaging Review No results found.  EKG Interpretation   None       MDM   1. Exposure to STD  2. BV (bacterial vaginosis)    Flagyl for BV. 1.2 M units PCN IM given for exposure to syphilis. RPR, HIV pending. GC/chlamydia pending. Recently treated for gc/chlamydia. No CMT. Asymptomatic. Infection care and precautions discussed, return precautions discussed. Patient states understanding of treatment care plan and is agreeable.    Trevor Mace, PA-C 11/26/12 [phone]

## 2012-11-25 NOTE — ED Notes (Signed)
Pt c/o needing to be checked out for syphilis, Pt partner stated that she needed to be checked out for this disease, Pt is Asymptomatic, but just checking out to be sure

## 2012-11-26 LAB — WET PREP, GENITAL: Yeast Wet Prep HPF POC: NONE SEEN

## 2012-11-26 LAB — HIV ANTIBODY (ROUTINE TESTING W REFLEX): HIV: NONREACTIVE

## 2012-11-26 LAB — RPR: RPR Ser Ql: NONREACTIVE

## 2012-11-26 MED ORDER — METRONIDAZOLE 500 MG PO TABS: 500.0000 mg | ORAL_TABLET | Freq: Two times a day (BID) | ORAL | Status: DC

## 2012-11-26 NOTE — ED Provider Notes (Signed)
Medical screening examination/treatment/procedure(s) were performed by non-physician practitioner and as supervising physician I was immediately available for consultation/collaboration.   Nesbit Michon, MD 11/26/12 0625 

## 2013-02-19 ENCOUNTER — Emergency Department (HOSPITAL_COMMUNITY)
Admission: EM | Admit: 2013-02-19 | Discharge: 2013-02-19 | Disposition: A | Discharge status: Home / Self Care | Payer: Medicaid Other | Attending: Emergency Medicine | Admitting: Emergency Medicine

## 2013-02-19 ENCOUNTER — Encounter (HOSPITAL_COMMUNITY): Payer: Self-pay | Admitting: Emergency Medicine

## 2013-02-19 DIAGNOSIS — Z3202 Encounter for pregnancy test, result negative: Secondary | ICD-10-CM | POA: Insufficient documentation

## 2013-02-19 DIAGNOSIS — R110 Nausea: Secondary | ICD-10-CM | POA: Insufficient documentation

## 2013-02-19 DIAGNOSIS — A5909 Other urogenital trichomoniasis: Secondary | ICD-10-CM | POA: Insufficient documentation

## 2013-02-19 DIAGNOSIS — R197 Diarrhea, unspecified: Secondary | ICD-10-CM | POA: Insufficient documentation

## 2013-02-19 DIAGNOSIS — N72 Inflammatory disease of cervix uteri: Secondary | ICD-10-CM

## 2013-02-19 DIAGNOSIS — Z8719 Personal history of other diseases of the digestive system: Secondary | ICD-10-CM | POA: Insufficient documentation

## 2013-02-19 DIAGNOSIS — F17200 Nicotine dependence, unspecified, uncomplicated: Secondary | ICD-10-CM | POA: Insufficient documentation

## 2013-02-19 DIAGNOSIS — N390 Urinary tract infection, site not specified: Secondary | ICD-10-CM

## 2013-02-19 DIAGNOSIS — A599 Trichomoniasis, unspecified: Secondary | ICD-10-CM

## 2013-02-19 LAB — CBC WITH DIFFERENTIAL/PLATELET
BASOS ABS: 0 10*3/uL (ref 0.0–0.1)
BASOS PCT: 0 % (ref 0–1)
EOS ABS: 0.5 10*3/uL (ref 0.0–0.7)
Eosinophils Relative: 4 % (ref 0–5)
HEMATOCRIT: 38.9 % (ref 36.0–46.0)
HEMOGLOBIN: 13.6 g/dL (ref 12.0–15.0)
LYMPHS ABS: 3.6 10*3/uL (ref 0.7–4.0)
LYMPHS PCT: 31 % (ref 12–46)
MCH: 28.9 pg (ref 26.0–34.0)
MCHC: 35 g/dL (ref 30.0–36.0)
MCV: 82.6 fL (ref 78.0–100.0)
MONO ABS: 1.2 10*3/uL — AB (ref 0.1–1.0)
Monocytes Relative: 10 % (ref 3–12)
NEUTROS ABS: 6.2 10*3/uL (ref 1.7–7.7)
Neutrophils Relative %: 55 % (ref 43–77)
Platelets: 344 10*3/uL (ref 150–400)
RBC: 4.71 MIL/uL (ref 3.87–5.11)
RDW: 13.8 % (ref 11.5–15.5)
WBC: 11.5 10*3/uL — ABNORMAL HIGH (ref 4.0–10.5)

## 2013-02-19 LAB — URINALYSIS, ROUTINE W REFLEX MICROSCOPIC
BILIRUBIN URINE: NEGATIVE
Glucose, UA: NEGATIVE mg/dL
KETONES UR: NEGATIVE mg/dL
NITRITE: POSITIVE — AB
PH: 5 (ref 5.0–8.0)
Protein, ur: NEGATIVE mg/dL
SPECIFIC GRAVITY, URINE: 1.023 (ref 1.005–1.030)
UROBILINOGEN UA: 0.2 mg/dL (ref 0.0–1.0)

## 2013-02-19 LAB — COMPREHENSIVE METABOLIC PANEL
ALBUMIN: 4 g/dL (ref 3.5–5.2)
ALK PHOS: 76 U/L (ref 39–117)
ALT: 14 U/L (ref 0–35)
AST: 18 U/L (ref 0–37)
BUN: 14 mg/dL (ref 6–23)
CO2: 21 mEq/L (ref 19–32)
CREATININE: 0.73 mg/dL (ref 0.50–1.10)
Calcium: 9.7 mg/dL (ref 8.4–10.5)
Chloride: 100 mEq/L (ref 96–112)
GFR calc non Af Amer: >90 mL/min (ref >90)
GLUCOSE: 94 mg/dL (ref 70–99)
Potassium: 4.5 mEq/L (ref 3.7–5.3)
Sodium: 137 mEq/L (ref 137–147)
TOTAL PROTEIN: 8.4 g/dL — AB (ref 6.0–8.3)
Total Bilirubin: 0.2 mg/dL — ABNORMAL LOW (ref 0.3–1.2)

## 2013-02-19 LAB — POCT PREGNANCY, URINE: PREG TEST UR: NEGATIVE

## 2013-02-19 LAB — WET PREP, GENITAL
Clue Cells Wet Prep HPF POC: NONE SEEN
YEAST WET PREP: NONE SEEN

## 2013-02-19 LAB — URINE MICROSCOPIC-ADD ON

## 2013-02-19 LAB — LIPASE, BLOOD: LIPASE: 25 U/L (ref 11–59)

## 2013-02-19 MED ORDER — NITROFURANTOIN MONOHYD MACRO 100 MG PO CAPS
100.0000 mg | ORAL_CAPSULE | Freq: Once | ORAL | Status: DC
  Filled 2013-02-19 (×2): qty 1

## 2013-02-19 MED ORDER — METRONIDAZOLE 500 MG PO TABS
2000.0000 mg | ORAL_TABLET | Freq: Once | ORAL | Status: AC
  Administered 2013-02-19: 2000 mg via ORAL
  Filled 2013-02-19: qty 4

## 2013-02-19 MED ORDER — IBUPROFEN 400 MG PO TABS
600.0000 mg | ORAL_TABLET | Freq: Once | ORAL | Status: AC
  Administered 2013-02-19: 600 mg via ORAL
  Filled 2013-02-19 (×2): qty 1

## 2013-02-19 MED ORDER — LIDOCAINE HCL (PF) 1 % IJ SOLN
INTRAMUSCULAR | Status: AC
  Administered 2013-02-19: 1 mL
  Filled 2013-02-19: qty 5

## 2013-02-19 MED ORDER — CEFTRIAXONE SODIUM 250 MG IJ SOLR
250.0000 mg | Freq: Once | INTRAMUSCULAR | Status: AC
  Administered 2013-02-19: 250 mg via INTRAMUSCULAR
  Filled 2013-02-19: qty 250

## 2013-02-19 MED ORDER — NITROFURANTOIN MONOHYD MACRO 100 MG PO CAPS: 100.0000 mg | ORAL_CAPSULE | Freq: Two times a day (BID) | ORAL | Status: DC

## 2013-02-19 MED ORDER — ONDANSETRON 4 MG PO TBDP
4.0000 mg | ORAL_TABLET | Freq: Once | ORAL | Status: AC
  Administered 2013-02-19: 4 mg via ORAL
  Filled 2013-02-19: qty 1

## 2013-02-19 MED ORDER — AZITHROMYCIN 250 MG PO TABS
1000.0000 mg | ORAL_TABLET | Freq: Once | ORAL | Status: AC
  Administered 2013-02-19: 1000 mg via ORAL
  Filled 2013-02-19: qty 4

## 2013-02-19 NOTE — ED Notes (Signed)
Pt reports generalized abd pain for extended amount of time. Having irregular periods and spotting. Having nausea and diarrhea, no vomiting, denies urinary symptoms.

## 2013-02-19 NOTE — ED Notes (Addendum)
Pt c/o intermittent abd pain for a few weeks, sts today it was really bad and it caused her to have an anxiety attack so she came into ED. Pt reports the pain has subsided at this time. Also c/o nausea and diarrhea. Pt sts she has also been having some intermittent headaches the past couple weeks. LMP 01/22/13. Denies urinary issues. Nad, skin warm and dry, resp e/u. Reports normal appetite.

## 2013-02-19 NOTE — ED Provider Notes (Signed)
TIME SEEN: 5:05 PM  CHIEF COMPLAINT: Abdominal pain  HPI: Patient is a 23 year old female with no significant past history presents emergency room with several weeks of diffuse abdominal cramping pain that was recently worse over the past several days. She's also had chills, nausea and diarrhea. She states it causes her to have anxiety attacks. She does say she had blood in her urine and she is also had a regular vaginal bleeding for the last several weeks that started again today. Her last normal menstrual period was 01/22/13. She denies a prior pregnancies. She is sexually active and has had gonorrhea and chlamydia multiple times. Denies any prior abdominal surgeries. No fever. Last normal bowel movement was today.  ROS: See HPI Constitutional: no fever  Eyes: no drainage  ENT: no runny nose   Cardiovascular:  no chest pain  Resp: no SOB  GI: no vomiting GU: no dysuria Integumentary: no rash  Allergy: no hives  Musculoskeletal: no leg swelling  Neurological: no slurred speech ROS otherwise negative  PAST MEDICAL HISTORY/PAST SURGICAL HISTORY:  Past Medical History  Diagnosis Date  . UTI (lower urinary tract infection)   . BV (bacterial vaginosis)   . GERD (gastroesophageal reflux disease)     MEDICATIONS:  Prior to Admission medications   Not on File    ALLERGIES:  Allergies  Allergen Reactions  . Pineapple Swelling    Tongue swelling    SOCIAL HISTORY:  History  Substance Use Topics  . Smoking status: Current Every Day Smoker    Types: Cigars  . Smokeless tobacco: Not on file  . Alcohol Use: No    FAMILY HISTORY: History reviewed. No pertinent family history.  EXAM: BP 116/75  Pulse 105  Temp(Src) 98.6 F (37 C) (Oral)  Resp 24  Ht 5' 2.5" (1.588 m)  Wt 163 lb 9.6 oz (74.208 kg)  BMI 29.43 kg/m2  SpO2 100%  LMP 01/22/2013 CONSTITUTIONAL: Alert and oriented and responds appropriately to questions. Well-appearing; well-nourished, smiling and joking,  playing on cell phone when I entered the room HEAD: Normocephalic EYES: Conjunctivae clear, PERRL ENT: normal nose; no rhinorrhea; moist mucous membranes; pharynx without lesions noted NECK: Supple, no meningismus, no LAD  CARD: RRR; S1 and S2 appreciated; no murmurs, no clicks, no rubs, no gallops RESP: Normal chest excursion without splinting or tachypnea; breath sounds clear and equal bilaterally; no wheezes, no rhonchi, no rales,  ABD/GI: Normal bowel sounds; non-distended; soft, non-tender, no rebound, no guarding, no peritoneal signs GU:  Normal external genitalia, patient has thick yellow vaginal discharge coming from her cervical os, she has a erythematous cervix with no cervical motion tenderness, cervix is friable, no adnexal tenderness or fullness, no vaginal bleeding BACK:  The back appears normal and is non-tender to palpation, there is no CVA tenderness EXT: Normal ROM in all joints; non-tender to palpation; no edema; normal capillary refill; no cyanosis    SKIN: Normal color for age and race; warm NEURO: Moves all extremities equally PSYCH: The patient's mood and manner are appropriate. Grooming and personal hygiene are appropriate.  MEDICAL DECISION MAKING: Patient here with complaints of abdominal pain. She is very well-appearing, in no apparent distress and has a very benign abdominal exam. Her labs show a leukocytosis of 11.5. Her urine has trace hemoglobin, leukocytes and is nitrite positive. Culture pending. Pregnancy test negative. Given patient has had history of current Chlamydia several times, will perform pelvic exam with cultures.   ED PROGRESS: Patient's pelvic exam concerning for cervicitis.  No CMT or adnexal tenderness to suggest PID or TOA. Given she has been positive for coronary Chlamydia in May and October 2014, will appear to be treated with ceftriaxone and azithromycin. Have discussed with patient that she will need to wait for her results prior to having sexual  intercourse with her partner. She understands that she is positive for STDs, her partner will also need to be treated.  6:39 PM  Pt prep is positive for trichomonas. Will treat with Flagyl. She has received ceftriaxone azithromycin the emergency department. Have discussed with patient that her partner will also need to be treated and she should avoid sexual intercourse until her partner has also been treated. We'll discharge him with Macrobid for UTI. Given return precautions. Patient verbalizes understanding and is comfortable plan.  Katherine MawKristen N Reginald Weida, DO 02/19/13 1839

## 2013-02-19 NOTE — ED Notes (Signed)
Pt left prior to getting medication.

## 2013-02-19 NOTE — Discharge Instructions (Signed)
Cervicitis Cervicitis is a soreness and swelling (inflammation) of the cervix. Your cervix is located at the bottom of your uterus. It opens up to the vagina. CAUSES   Sexually transmitted infections (STIs).   Allergic reaction.   Medicines or birth control devices that are put in the vagina.   Injury to the cervix.   Bacterial infections.  RISK FACTORS You are at greater risk if you:  Have unprotected sexual intercourse.  Have sexual intercourse with many partners.  Began sexual intercourse at an early age.  Have a history of STIs. SYMPTOMS  There may be no symptoms. If symptoms occur, they may include:   Grey, white, yellow, or bad-smelling vaginal discharge.   Pain or itching of the area outside the vagina.   Painful sexual intercourse.   Lower abdominal or lower back pain, especially during intercourse.   Frequent urination.   Abnormal vaginal bleeding between periods, after sexual intercourse, or after menopause.   Pressure or a heavy feeling in the pelvis.  DIAGNOSIS  Diagnosis is made after a pelvic exam. Other tests may include:   Examination of any discharge under a microscope (wet prep).   A Pap test.  TREATMENT  Treatment will depend on the cause of cervicitis. If it is caused by an STI, both you and your partner will need to be treated. Antibiotic medicines will be given.  HOME CARE INSTRUCTIONS   Do not have sexual intercourse until your health care provider says it is okay.   Do not have sexual intercourse until your partner has been treated, if your cervicitis is caused by an STI.   Take your antibiotics as directed. Finish them even if you start to feel better.  SEEK MEDICAL CARE IF:  Your symptoms come back.   You have a fever.  MAKE SURE YOU:   Understand these instructions.  Will watch your condition.  Will get help right away if you are not doing well or get worse. Document Released: 01/08/2005 Document Revised:  09/10/2012 Document Reviewed: 07/02/2012 [facility] Patient Information 2014 [place], [place].  Trichomoniasis Trichomoniasis is an infection, caused by the Trichomonas organism, that affects both women and men. In women, the outer female genitalia and the vagina are affected. In men, the penis is mainly affected, but the prostate and other reproductive organs can also be involved. Trichomoniasis is a sexually transmitted disease (STD) and is most often passed to another person through sexual contact. The majority of people who get trichomoniasis do so from a sexual encounter and are also at risk for other STDs. CAUSES   Sexual intercourse with an infected partner.  It can be present in swimming pools or hot tubs. SYMPTOMS   Abnormal gray-green frothy vaginal discharge in women.  Vaginal itching and irritation in women.  Itching and irritation of the area outside the vagina in women.  Penile discharge with or without pain in males.  Inflammation of the urethra (urethritis), causing painful urination.  Bleeding after sexual intercourse. RELATED COMPLICATIONS  Pelvic inflammatory disease.  Infection of the uterus (endometritis).  Infertility.  Tubal (ectopic) pregnancy.  It can be associated with other STDs, including gonorrhea and chlamydia, hepatitis B, and HIV. COMPLICATIONS DURING PREGNANCY  Early (premature) delivery.  Premature rupture of the membranes (PROM).  Low birth weight. DIAGNOSIS   Visualization of Trichomonas under the microscope from the vagina discharge.  Ph of the vagina greater than 4.5, tested with a test tape.  Trich Rapid Test.  Culture of the organism, but this  is not usually needed.  It may be found on a Pap test.  Having a "strawberry cervix,"which means the cervix looks very red like a strawberry. TREATMENT   You may be given medication to fight the infection. Inform your caregiver if you could be or are pregnant. Some medications used to  treat the infection should not be taken during pregnancy.  Over-the-counter medications or creams to decrease itching or irritation may be recommended.  Your sexual partner will need to be treated if infected. HOME CARE INSTRUCTIONS   Take all medication prescribed by your caregiver.  Take over-the-counter medication for itching or irritation as directed by your caregiver.  Do not have sexual intercourse while you have the infection.  Do not douche or wear tampons.  Discuss your infection with your partner, as your partner may have acquired the infection from you. Or, your partner may have been the person who transmitted the infection to you.  Have your sex partner examined and treated if necessary.  Practice safe, informed, and protected sex.  See your caregiver for other STD testing. SEEK MEDICAL CARE IF:   You still have symptoms after you finish the medication.  You have an oral temperature above 102 F (38.9 C).  You develop belly (abdominal) pain.  You have pain when you urinate.  You have bleeding after sexual intercourse.  You develop a rash.  The medication makes you sick or makes you throw up (vomit). Document Released: 07/04/2000 Document Revised: 04/02/2011 Document Reviewed: 07/30/2008 [facility] Patient Information 2014 [place], [place].  Urinary Tract Infection Urinary tract infections (UTIs) can develop anywhere along your urinary tract. Your urinary tract is your body's drainage system for removing wastes and extra water. Your urinary tract includes two kidneys, two ureters, a bladder, and a urethra. Your kidneys are a pair of bean-shaped organs. Each kidney is about the size of your fist. They are located below your ribs, one on each side of your spine. CAUSES Infections are caused by microbes, which are microscopic organisms, including fungi, viruses, and bacteria. These organisms are so small that they can only be seen through a microscope. Bacteria are  the microbes that most commonly cause UTIs. SYMPTOMS  Symptoms of UTIs may vary by age and gender of the patient and by the location of the infection. Symptoms in young women typically include a frequent and intense urge to urinate and a painful, burning feeling in the bladder or urethra during urination. Older women and men are more likely to be tired, shaky, and weak and have muscle aches and abdominal pain. A fever may mean the infection is in your kidneys. Other symptoms of a kidney infection include pain in your back or sides below the ribs, nausea, and vomiting. DIAGNOSIS To diagnose a UTI, your caregiver will ask you about your symptoms. Your caregiver also will ask to provide a urine sample. The urine sample will be tested for bacteria and white blood cells. White blood cells are made by your body to help fight infection. TREATMENT  Typically, UTIs can be treated with medication. Because most UTIs are caused by a bacterial infection, they usually can be treated with the use of antibiotics. The choice of antibiotic and length of treatment depend on your symptoms and the type of bacteria causing your infection. HOME CARE INSTRUCTIONS  If you were prescribed antibiotics, take them exactly as your caregiver instructs you. Finish the medication even if you feel better after you have only taken some of the medication.  Drink enough water and fluids to keep your urine clear or pale yellow.  Avoid caffeine, tea, and carbonated beverages. They tend to irritate your bladder.  Empty your bladder often. Avoid holding urine for long periods of time.  Empty your bladder before and after sexual intercourse.  After a bowel movement, women should cleanse from front to back. Use each tissue only once. SEEK MEDICAL CARE IF:   You have back pain.  You develop a fever.  Your symptoms do not begin to resolve within 3 days. SEEK IMMEDIATE MEDICAL CARE IF:   You have severe back pain or lower abdominal  pain.  You develop chills.  You have nausea or vomiting.  You have continued burning or discomfort with urination. MAKE SURE YOU:   Understand these instructions.  Will watch your condition.  Will get help right away if you are not doing well or get worse. Document Released: 10/18/2004 Document Revised: 07/10/2011 Document Reviewed: 02/16/2011 [facility] Patient Information 2014 [place], [place].

## 2013-02-20 LAB — GC/CHLAMYDIA PROBE AMP
CT PROBE, AMP APTIMA: NEGATIVE
GC PROBE AMP APTIMA: NEGATIVE

## 2013-02-21 LAB — URINE CULTURE

## 2013-02-22 ENCOUNTER — Telehealth (HOSPITAL_COMMUNITY): Payer: Self-pay | Admitting: Emergency Medicine

## 2013-02-22 NOTE — ED Notes (Signed)
Post ED Visit - Positive Culture Follow-up  Culture report reviewed by antimicrobial stewardship pharmacist: []  Wes Dulaney, Pharm.D., BCPS []  Celedonio MiyamotoJeremy Frens, Pharm.D., BCPS []  Georgina PillionElizabeth Martin, Pharm.D., BCPS []  TroyMinh Pham, [address] Rainbow BoulevardPharm.D., BCPS, AAHIVP [x]  Estella HuskMichelle Turner, Pharm.D., BCPS, AAHIVP  Positive urine culture Treated with Macrobid, organism sensitive to the same and no further patient follow-up is required at this time.  [place], Jenel LucksKylie 02/22/2013, 5:27 PM

## 2013-05-25 IMAGING — CR DG CHEST 2V
2 series · 2 of 2 positions shown · non-contrast
Comparison: None

CLINICAL DATA: Chest pain

CHEST - 2 VIEW

[w chest pa]
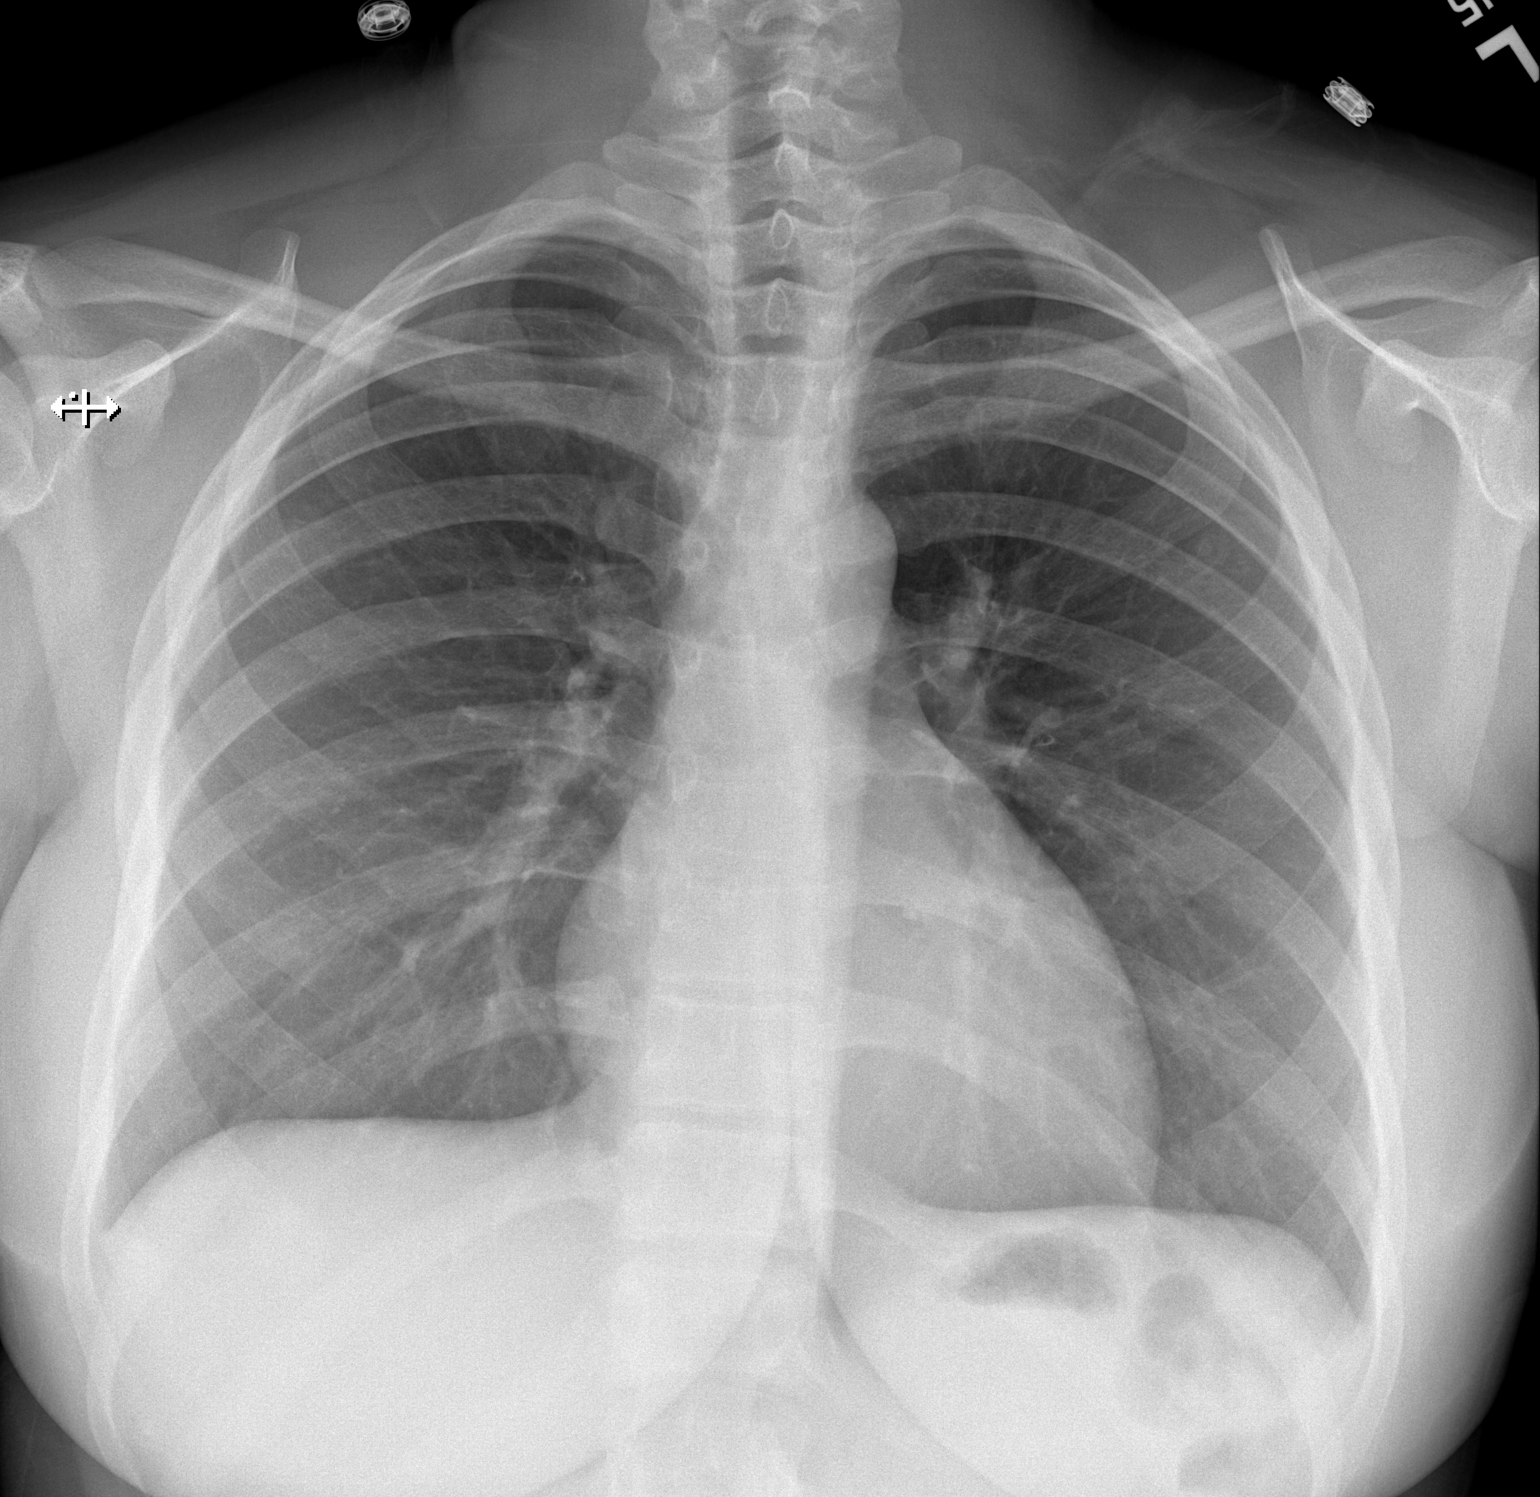

[w chest lat]
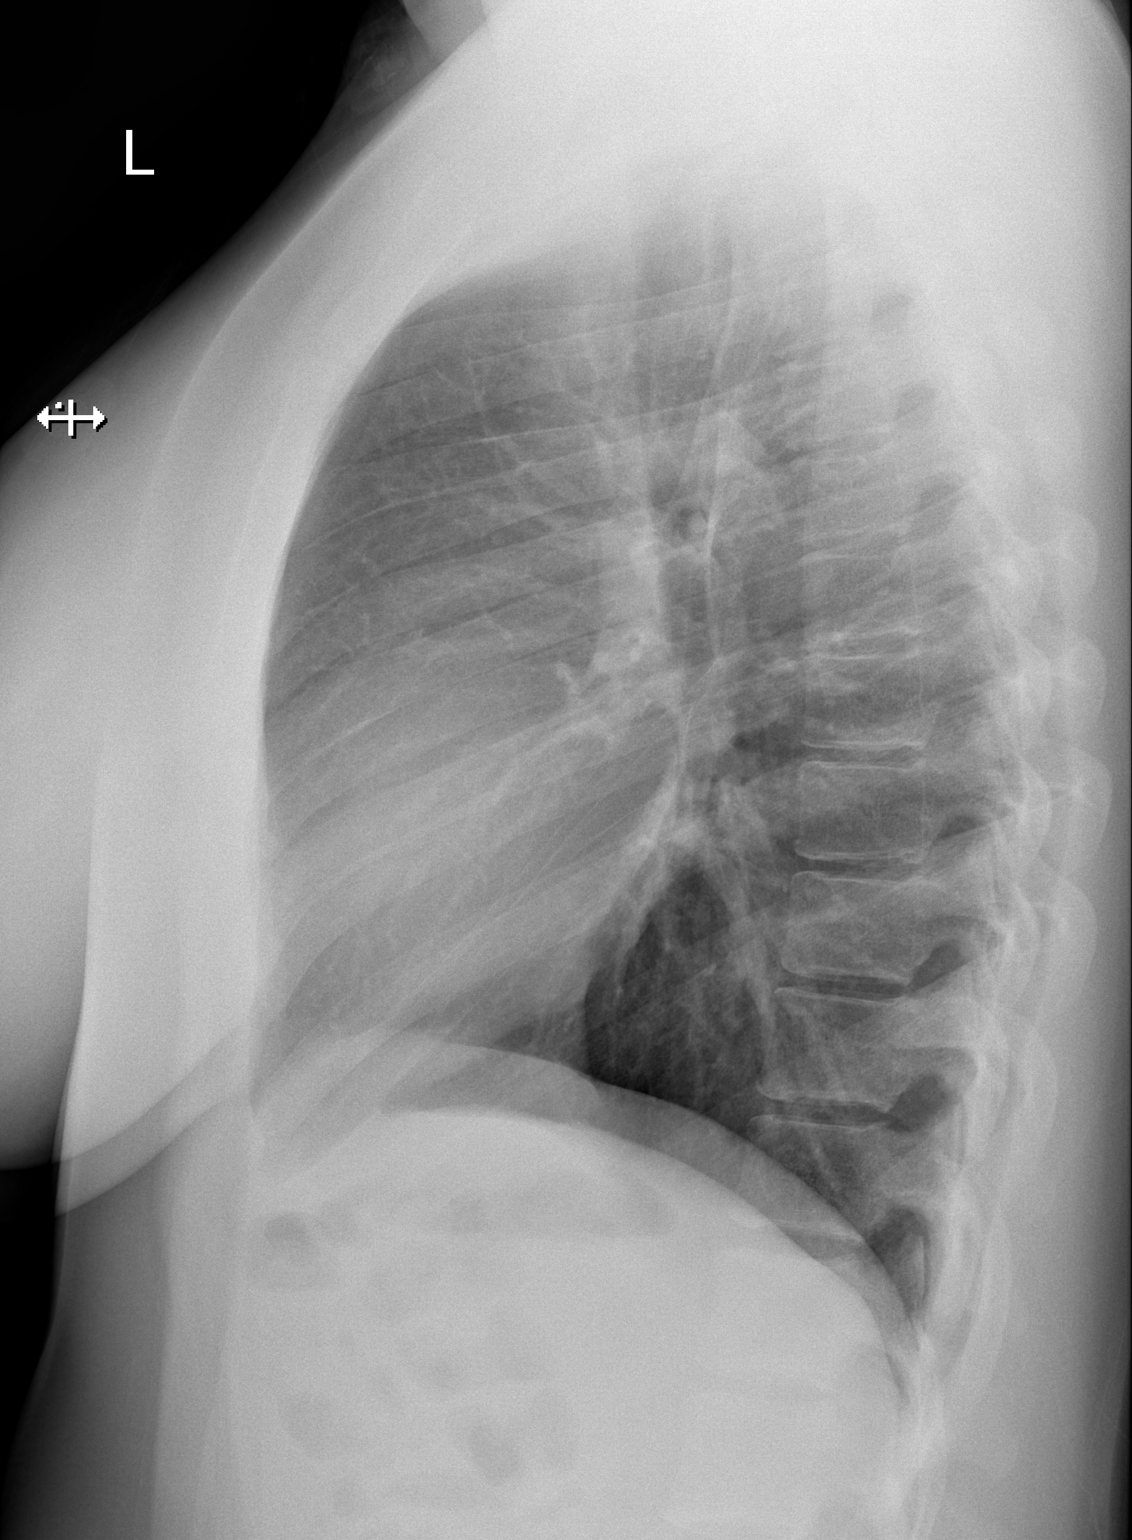

[2 of 2 positions shown; findings below may reference images not displayed]

FINDINGS: The heart size and mediastinal contours are within normal
limits.  Both lungs are clear.  The visualized skeletal structures
are unremarkable.
IMPRESSION: Negative exam.

## 2013-06-12 IMAGING — CR DG CHEST 2V
2 series · 2 of 2 positions shown · non-contrast
Comparison: 07/02/2011

CLINICAL DATA: Shortness of breath.

CHEST - 2 VIEW

[w chest pa]
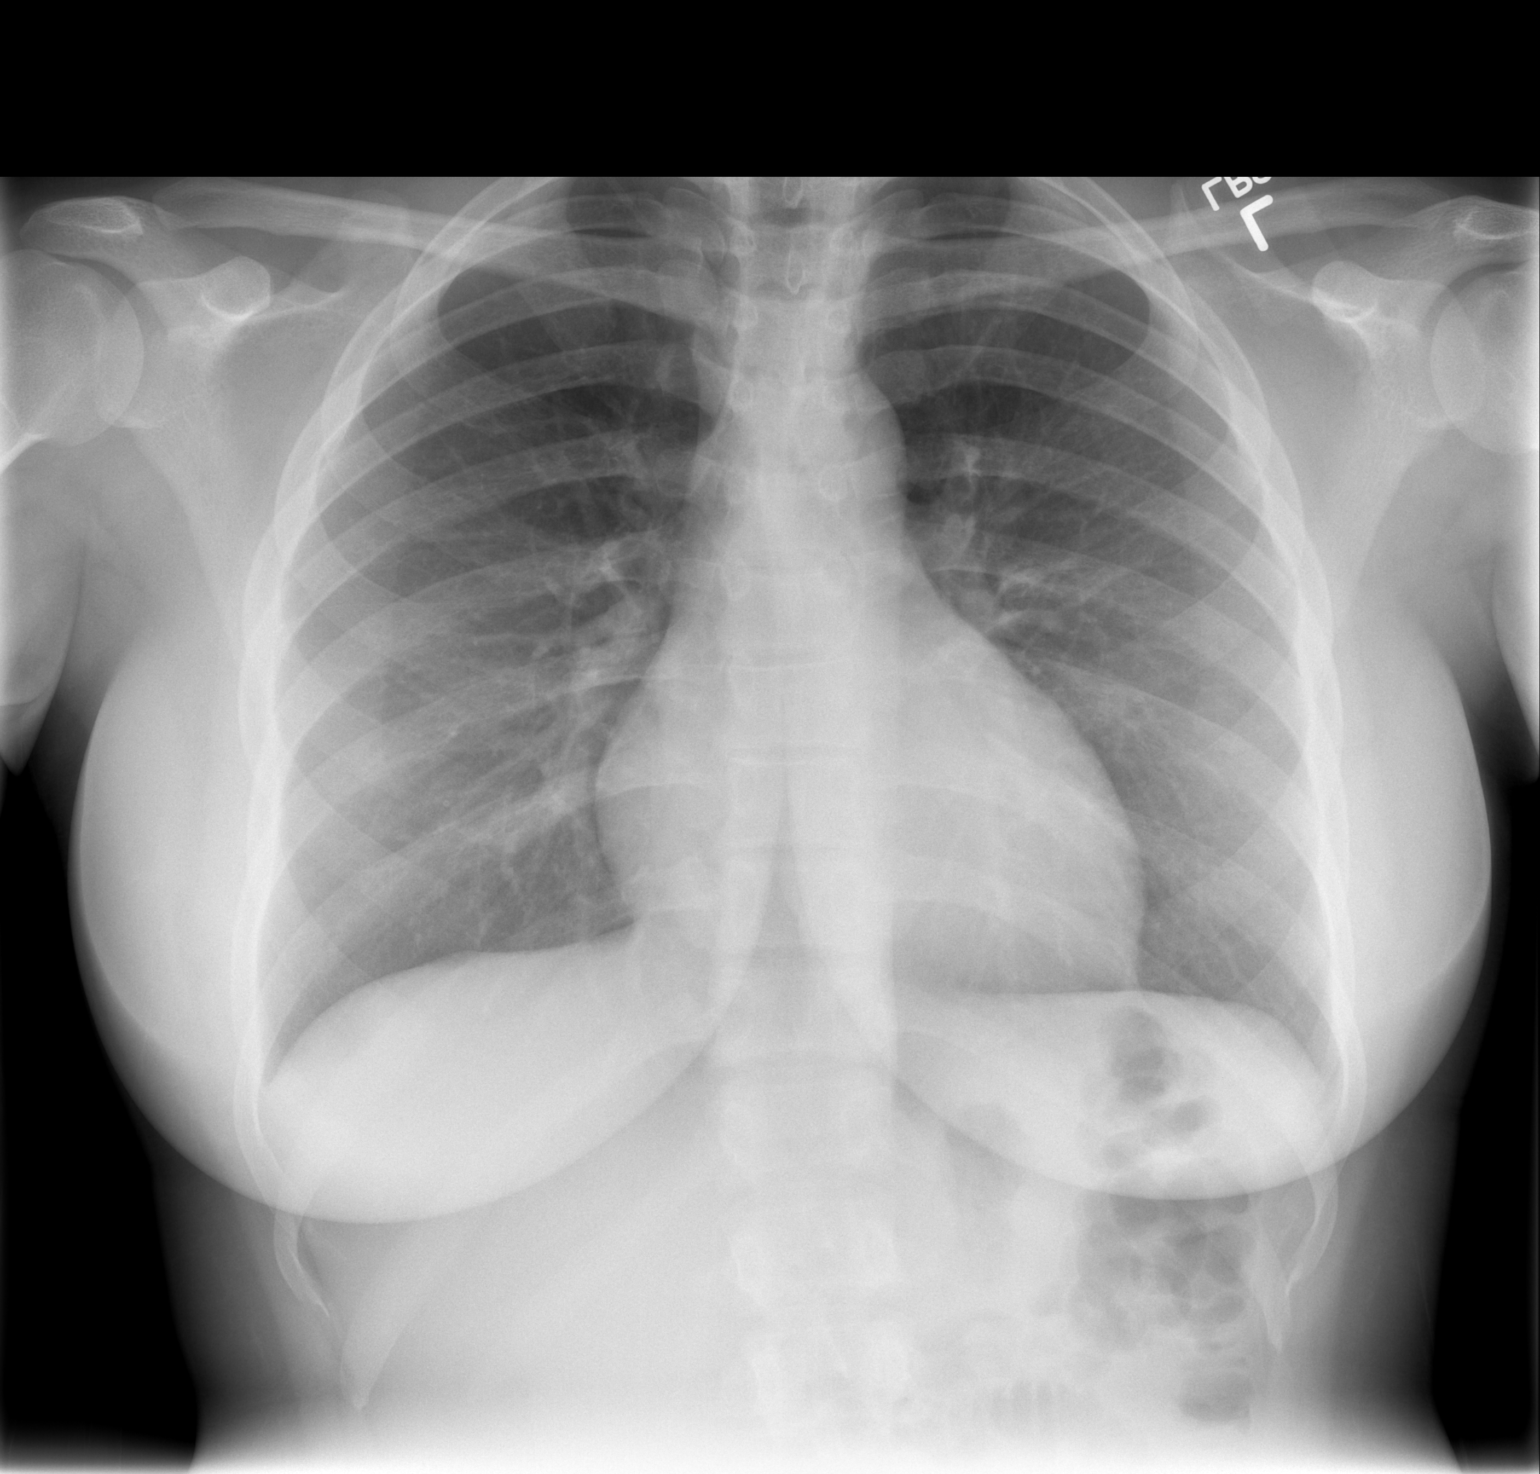

[w chest lat]
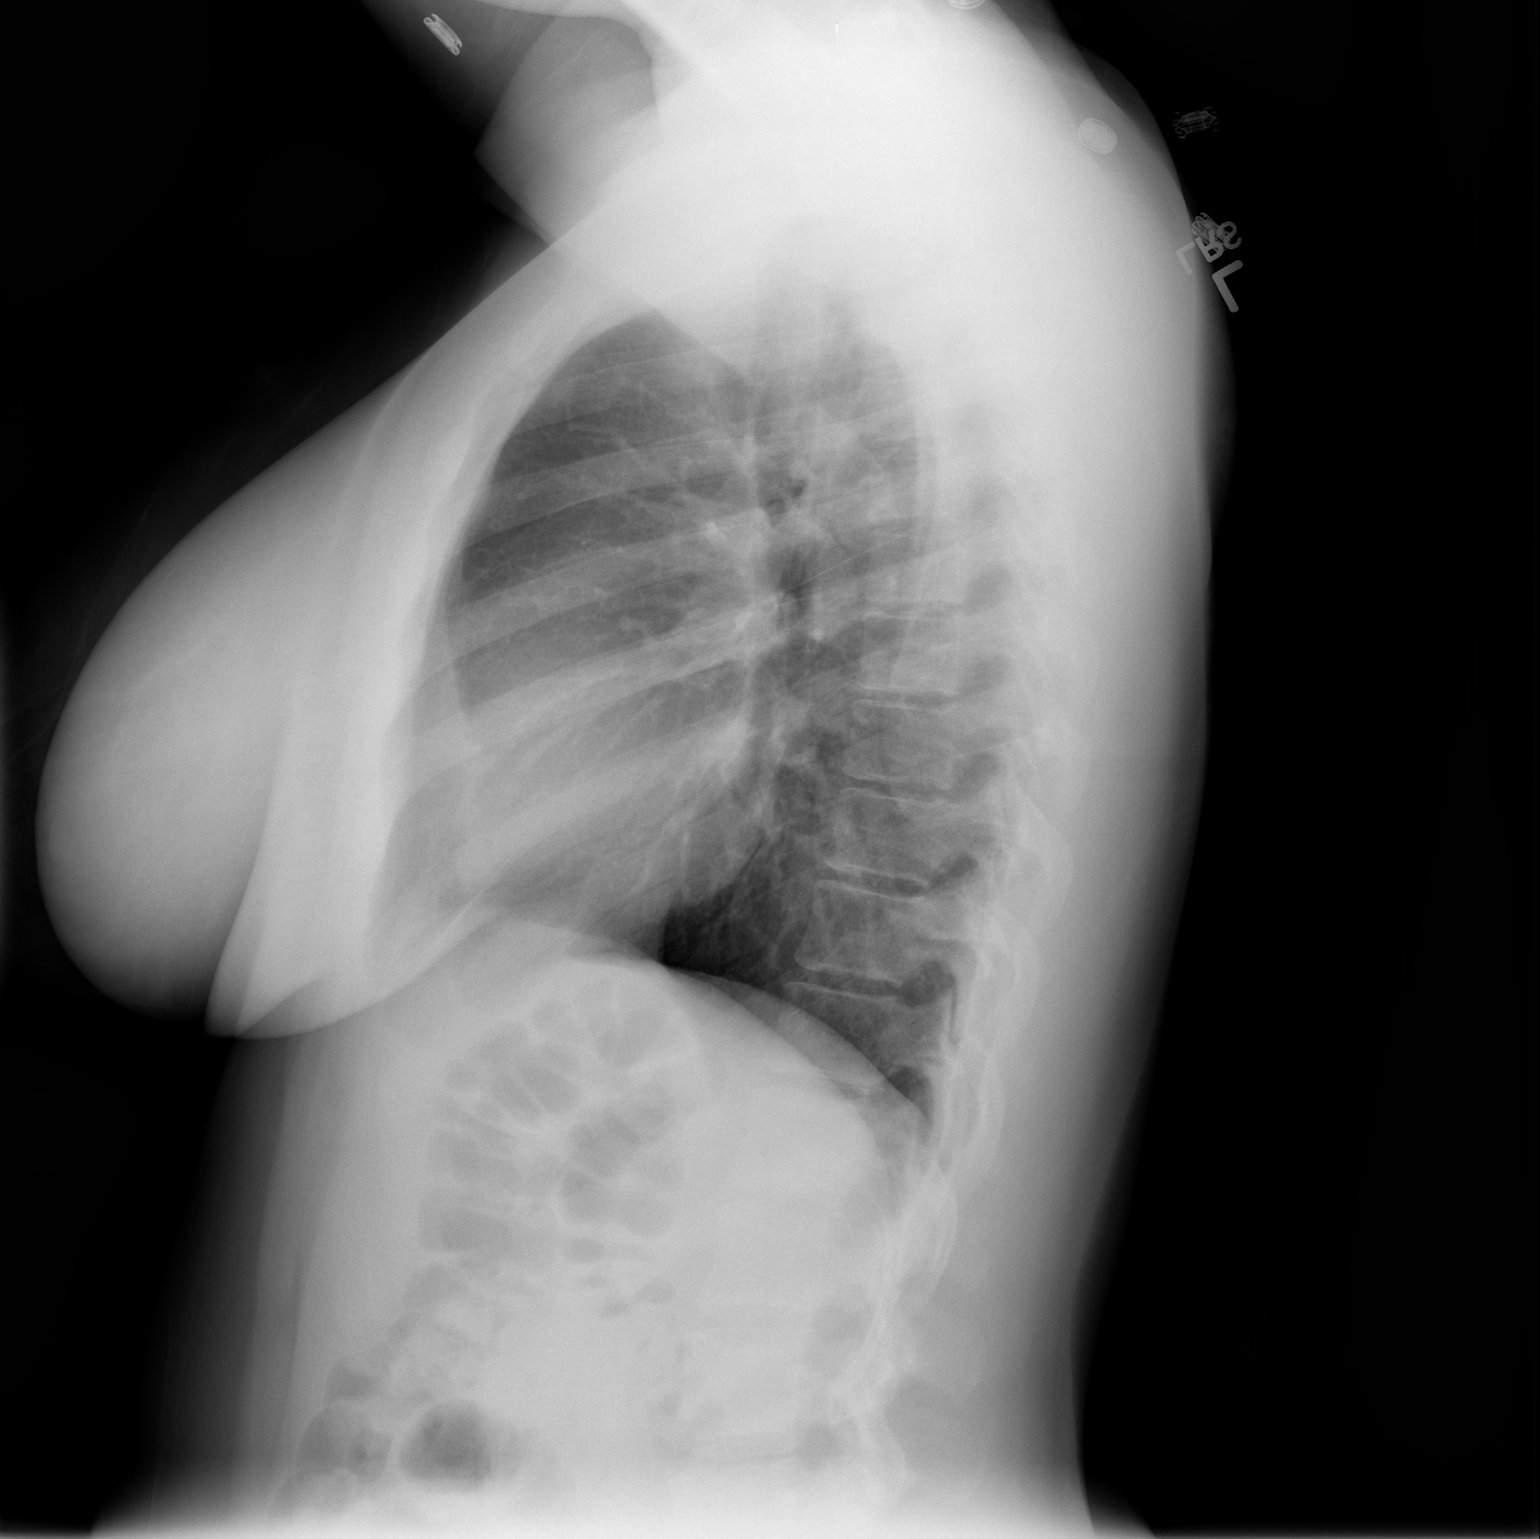

[2 of 2 positions shown; findings below may reference images not displayed]

FINDINGS: The heart size and pulmonary vascularity are normal. The
lungs appear clear and expanded without focal air space disease or
consolidation. No blunting of the costophrenic angles.  No
pneumothorax.  No significant change since previous study.
IMPRESSION: No evidence of active pulmonary disease.

## 2013-08-10 ENCOUNTER — Encounter (HOSPITAL_COMMUNITY): Payer: Self-pay | Admitting: *Deleted

## 2013-08-10 ENCOUNTER — Inpatient Hospital Stay (HOSPITAL_COMMUNITY)
Admission: AD | Admit: 2013-08-10 | Discharge: 2013-08-10 | Disposition: A | Discharge status: Home / Self Care | Payer: Medicaid Other | Source: Ambulatory Visit | Attending: Obstetrics & Gynecology | Admitting: Obstetrics & Gynecology

## 2013-08-10 DIAGNOSIS — N898 Other specified noninflammatory disorders of vagina: Secondary | ICD-10-CM | POA: Diagnosis not present

## 2013-08-10 DIAGNOSIS — F17200 Nicotine dependence, unspecified, uncomplicated: Secondary | ICD-10-CM | POA: Insufficient documentation

## 2013-08-10 DIAGNOSIS — Z202 Contact with and (suspected) exposure to infections with a predominantly sexual mode of transmission: Secondary | ICD-10-CM | POA: Diagnosis not present

## 2013-08-10 HISTORY — DX: Gonococcal infection, unspecified: A54.9

## 2013-08-10 HISTORY — DX: Chlamydial infection, unspecified: A74.9

## 2013-08-10 LAB — URINALYSIS, ROUTINE W REFLEX MICROSCOPIC
BILIRUBIN URINE: NEGATIVE
Glucose, UA: NEGATIVE mg/dL
Hgb urine dipstick: NEGATIVE
Ketones, ur: NEGATIVE mg/dL
Nitrite: NEGATIVE
PH: 7 (ref 5.0–8.0)
Protein, ur: NEGATIVE mg/dL
SPECIFIC GRAVITY, URINE: 1.02 (ref 1.005–1.030)
UROBILINOGEN UA: 0.2 mg/dL (ref 0.0–1.0)

## 2013-08-10 LAB — POCT PREGNANCY, URINE: PREG TEST UR: NEGATIVE

## 2013-08-10 LAB — URINE MICROSCOPIC-ADD ON

## 2013-08-10 LAB — WET PREP, GENITAL
TRICH WET PREP: NONE SEEN
YEAST WET PREP: NONE SEEN

## 2013-08-10 MED ORDER — CEFTRIAXONE SODIUM 250 MG IJ SOLR
250.0000 mg | Freq: Once | INTRAMUSCULAR | Status: AC
  Administered 2013-08-10: 250 mg via INTRAMUSCULAR
  Filled 2013-08-10: qty 250

## 2013-08-10 MED ORDER — AZITHROMYCIN 250 MG PO TABS
1000.0000 mg | ORAL_TABLET | Freq: Once | ORAL | Status: AC
  Administered 2013-08-10: 1000 mg via ORAL
  Filled 2013-08-10: qty 4

## 2013-08-10 NOTE — MAU Provider Note (Signed)
Attestation of Attending Supervision of Advanced Practitioner (CNM/NP): Evaluation and management procedures were performed by the Advanced Practitioner under my supervision and collaboration.  I have reviewed the Advanced Practitioner's note and chart, and I agree with the management and plan.  HARRAWAY-SMITH, Bekah Igoe 9:58 PM

## 2013-08-10 NOTE — Discharge Instructions (Signed)
Gonorrhea Gonorrhea is an infection that can cause serious problems. If left untreated, may   Damage the female or female organs.   Cause women to be unable to have children (sterility).   Harm a fetus, if the infected woman is pregnant.  It is important to get treatment for gonorrhea as soon as possible. It is also necessary that all your sexual partners be tested for the infection.  CAUSES  Gonorrhea is caused by bacteria called Neisseria gonorrhoeae. The infection is spread from person to person, usually by sexual contact (such as by anal, vaginal, or oral means). A newborn can contract the infection from his or her mother during birth.  SYMPTOMS  Some people with gonorrhea do not have symptoms. Symptoms may be different in females and males.  Females The most common symptoms are:   Pain in the lower abdomen.   Fever with or without chills.  Other symptoms include:   Abnormal vaginal discharge.   Painful intercourse.   Burning or itching of the vagina or lips of the vagina.   Abnormal vaginal bleeding.   Pain when urinating.   Long-lasting (chronic) pain in the lower abdomen, especially during menstruation or intercourse.   Inability to become pregnant.   Going into premature labor.   Irritation, pain, bleeding, or discharge from the rectum. This may occur if the infection was spread by anal sex.   Sore throat or swollen neck lymph nodes. This may occur if the infection was spread by oral sex.  Males The most common symptoms are:   Discharge from the penis.   Pain or burning during urination.   Pain or swelling in the testicles. Other symptoms may include:   Irritation, pain, bleeding, or discharge from the rectum. This may occur if the infection was spread by anal sex.   Sore throat, fever, or swollen neck lymph nodes. This may occur if the infection was spread by oral sex.  DIAGNOSIS  A diagnosis is made after a physical exam is done and a  sample of discharge is examined under a microscope for the presence of the bacteria. The discharge may be taken from the urethra, cervix, throat, or rectum.  TREATMENT  Gonorrhea is treated with antibiotic medicines. It is important for treatment to begin as soon as possible. Early treatment may prevent some problems from developing.  HOME CARE INSTRUCTIONS   Only take over-the-counter or prescription medicines for pain, fever, or discomfort as directed by your health care provider.   Take antibiotics as directed. Make sure you finish them even if you start to feel better. Incomplete treatment will put you at risk for continued infection.   Do not have sex until treatment is complete or as directed by your health care provider.   Follow up with your health care provider as directed.   Not all test results are available during your visit. If your test results are not back during the visit, make an appointment with your health care provider to find out the results. Do not assume everything is normal if you have not heard from your health care provider or the medical facility. It is important for you to follow up on all of your test results.   If you test positive for gonorrhea, inform your recent sexual partners. They need to be checked for gonorrhea even if they do not have symptoms. They may need treatment, even if they test negative for gonorrhea.  SEEK MEDICAL CARE IF:   You develop any bad  reaction to the medicine you were prescribed. This may include:   A rash.   Nausea.   Vomiting.   Diarrhea.   Your symptoms do not improve after a few days of taking antibiotics.   Your symptoms get worse.   You develop increased pain, such as in the testicles (for males) or in the abdomen (for females).  SEEK IMMEDIATE MEDICAL CARE IF:  You have a fever or persistent symptoms for more than 2-3 days.   You have a fever and your symptoms suddenly get worse.  MAKE SURE YOU:     Understand these instructions.  Will watch your condition.  Will get help right away if you are not doing well or get worse. Document Released: 01/06/2000 Document Revised: 10/29/2012 Document Reviewed: 07/16/2012 [facility] Patient Information 2015 [place], [place]. This information is not intended to replace advice given to you by your health care provider. Make sure you discuss any questions you have with your health care provider. Chlamydia Chlamydia is an infection. It is spread through sexual contact. Chlamydia can be in different areas of the body. These areas include the cervix, urethra, throat, or rectum. You may not know you have chlamydia because many people never develop the symptoms. Chlamydia is not difficult to treat once you know you have it. However, if it is left untreated, chlamydia can lead to more serious health problems.  CAUSES  Chlamydia is caused by bacteria. It is a sexually transmitted disease. It is passed from an infected partner during intimate contact. This contact could be with the genitals, mouth, or rectal area. Chlamydia can also be passed from mothers to babies during birth. SIGNS AND SYMPTOMS  There may not be any symptoms. This is often the case early in the infection. If symptoms develop, they may include:  Mild pain and discomfort when urinating.  Redness, soreness, and swelling (inflammation) of the rectum.  Vaginal discharge.  Painful intercourse.  Abdominal pain.  Bleeding between menstrual periods. DIAGNOSIS  To diagnose this infection, your health care provider will do a pelvic exam. Cultures will be taken of the vagina, cervix, urine, and possibly the rectum to verify the diagnosis.  TREATMENT You will be given antibiotic medicines. If you are pregnant, certain types of antibiotics will need to be avoided. Any sexual partners should also be treated, even if they do not show symptoms.  HOME CARE INSTRUCTIONS   Take your antibiotics as  directed. Make sure you finish them even if you start to feel better.  Only take over-the-counter or prescription medicines for pain, discomfort, or fever as directed by your health care provider.  Inform any sexual partners about the infection. They should also be treated.  Do not have sexual contact until your health care provider tells you it is okay.  Get plenty of rest.  Eat a well-balanced diet.  Drink enough fluids to keep your urine clear or pale yellow.  Follow up with your health care provider as directed. SEEK MEDICAL CARE IF:  You have painful urination.  You have abdominal pain.  You have vaginal discharge.  You have painful sexual intercourse.  You are a woman and have bleeding between periods and after sex.  You have a fever or persistent symptoms for more than 2-3 days. SEEK IMMEDIATE MEDICAL CARE IF:   You have a fever and your symptoms suddenly get worse.  You experience nausea or vomiting.  You experience excessive sweating (diaphoresis).  You have difficulty swallowing. MAKE SURE YOU:   Understand  these instructions.  Will watch your condition.  Will get help right away if you are not doing well or get worse. Document Released: 10/18/2004 Document Revised: 01/13/2013 Document Reviewed: 09/15/2012 [facility] Patient Information 2015 [place], [place]. This information is not intended to replace advice given to you by your health care provider. Make sure you discuss any questions you have with your health care provider.

## 2013-08-10 NOTE — MAU Provider Note (Signed)
  History     CSN: [id number]  Arrival date and time: 08/10/13 1950   First Provider Initiated Contact with Patient 08/10/13 2101      Chief Complaint  Patient presents with  . Dehydration  . Vaginal Discharge  . Exposure to STD   Vaginal Discharge The patient's primary symptoms include a vaginal discharge.  Exposure to STD     Cablevision SystemsSierra Andrews is a 23 y.o. G0P0 who presents today for STD testing. She states that her partner told her two day ago that he had chlamydia.   Past Medical History  Diagnosis Date  . UTI (lower urinary tract infection)   . BV (bacterial vaginosis)   . GERD (gastroesophageal reflux disease)   . Gonorrhea   . Chlamydia     Past Surgical History  Procedure Laterality Date  . No past surgeries      History reviewed. No pertinent family history.  History  Substance Use Topics  . Smoking status: Current Every Day Smoker    Types: Cigars  . Smokeless tobacco: Not on file  . Alcohol Use: No    Allergies:  Allergies  Allergen Reactions  . Pineapple Swelling    Tongue swelling    No prescriptions prior to admission    Review of Systems  Genitourinary: Positive for vaginal discharge.   Physical Exam   Blood pressure 123/76, pulse 74, temperature 99 F (37.2 C), temperature source Oral, resp. rate 18, height 5' 3.4" (1.61 m), weight 78.472 kg (173 lb), SpO2 100.00%.  Physical Exam  Nursing note and vitals reviewed. Constitutional: She is oriented to person, place, and time. She appears well-developed and well-nourished. No distress.  Cardiovascular: Normal rate.   Respiratory: Effort normal.  GI: Soft. There is no tenderness.  Neurological: She is alert and oriented to person, place, and time.  Skin: Skin is warm and dry.  Psychiatric: She has a normal mood and affect.    MAU Course  Procedures Results for orders placed during the hospital encounter of 08/10/13 (from the past 24 hour(s))  POCT PREGNANCY, URINE     Status:  None   Collection Time    08/10/13  8:28 PM      Result Value Ref Range   Preg Test, Ur NEGATIVE  NEGATIVE  WET PREP, GENITAL     Status: Abnormal   Collection Time    08/10/13  9:04 PM      Result Value Ref Range   Yeast Wet Prep HPF POC NONE SEEN  NONE SEEN   Trich, Wet Prep NONE SEEN  NONE SEEN   Clue Cells Wet Prep HPF POC FEW (*) NONE SEEN   WBC, Wet Prep HPF POC FEW (*) NONE SEEN     Treated with 1 gram Azithromycin and 250 mg rocephin here in MAU   Assessment and Plan   1. STD exposure    Treated here today GC/CT pending  Follow-up Information   Follow up with [facility] [place]. (As needed)    Contact information:   [address] [place] [address] [phone]      Tawnya CrookHogan, Heather Donovan 08/10/2013, 9:04 PM

## 2013-08-10 NOTE — MAU Note (Signed)
Pt complains that she feels like she is dehydrated. Also states that her ex called and told her that he has chlamydia and would like to be tested and treated. Has a vaginal discharge with an odor. No vaginal bleeding. LMP: cannot remember because she has irregular periods. No birth control. Had some spotting for a day and it went away. Has abdominal pain-6 that comes and goes.

## 2013-08-11 LAB — GC/CHLAMYDIA PROBE AMP
CT Probe RNA: POSITIVE — AB
GC Probe RNA: POSITIVE — AB

## 2013-10-06 ENCOUNTER — Encounter (HOSPITAL_COMMUNITY): Payer: Self-pay | Admitting: Emergency Medicine

## 2013-10-06 ENCOUNTER — Emergency Department (HOSPITAL_COMMUNITY)
Admission: EM | Admit: 2013-10-06 | Discharge: 2013-10-06 | Disposition: A | Discharge status: Home / Self Care | Payer: Medicaid Other | Attending: Emergency Medicine | Admitting: Emergency Medicine

## 2013-10-06 DIAGNOSIS — Z87440 Personal history of urinary (tract) infections: Secondary | ICD-10-CM | POA: Insufficient documentation

## 2013-10-06 DIAGNOSIS — F17200 Nicotine dependence, unspecified, uncomplicated: Secondary | ICD-10-CM | POA: Diagnosis not present

## 2013-10-06 DIAGNOSIS — N3090 Cystitis, unspecified without hematuria: Secondary | ICD-10-CM | POA: Diagnosis not present

## 2013-10-06 DIAGNOSIS — Z8619 Personal history of other infectious and parasitic diseases: Secondary | ICD-10-CM | POA: Insufficient documentation

## 2013-10-06 DIAGNOSIS — Z8719 Personal history of other diseases of the digestive system: Secondary | ICD-10-CM | POA: Insufficient documentation

## 2013-10-06 DIAGNOSIS — R350 Frequency of micturition: Secondary | ICD-10-CM | POA: Insufficient documentation

## 2013-10-06 DIAGNOSIS — R300 Dysuria: Secondary | ICD-10-CM | POA: Insufficient documentation

## 2013-10-06 DIAGNOSIS — N3001 Acute cystitis with hematuria: Secondary | ICD-10-CM

## 2013-10-06 LAB — URINALYSIS, ROUTINE W REFLEX MICROSCOPIC
Bilirubin Urine: NEGATIVE
Glucose, UA: NEGATIVE mg/dL
Ketones, ur: NEGATIVE mg/dL
NITRITE: NEGATIVE
PROTEIN: 100 mg/dL — AB
Specific Gravity, Urine: 1.026 (ref 1.005–1.030)
Urobilinogen, UA: 0.2 mg/dL (ref 0.0–1.0)
pH: 5.5 (ref 5.0–8.0)

## 2013-10-06 LAB — URINE MICROSCOPIC-ADD ON

## 2013-10-06 MED ORDER — NITROFURANTOIN MONOHYD MACRO 100 MG PO CAPS: 100.0000 mg | ORAL_CAPSULE | Freq: Two times a day (BID) | ORAL | Status: DC

## 2013-10-06 MED ORDER — PHENAZOPYRIDINE HCL 200 MG PO TABS: 200.0000 mg | ORAL_TABLET | Freq: Three times a day (TID) | ORAL | Status: DC

## 2013-10-06 NOTE — ED Notes (Signed)
Pt reports dysuria, urinary frequency and hematuria x 3 days. Denies flank pain. Denies fever or chills. Hx of UTI, states this feels the same. NAD.

## 2013-10-06 NOTE — ED Provider Notes (Signed)
Medical screening examination/treatment/procedure(s) were performed by non-physician practitioner and as supervising physician I was immediately available for consultation/collaboration.   EKG Interpretation None        Glynn Octave, MD 10/06/13 [phone]

## 2013-10-06 NOTE — ED Provider Notes (Signed)
CSN: [id number]     Arrival date & time 10/06/13  1747 History  This chart was scribed for non-physician practitioner, Rhea Bleacher, PA-C working with Glynn Octave, MD by Greggory Stallion, ED scribe. This patient was seen in room TR06C/TR06C and the patient's care was started at 8:40 PM.   Chief Complaint  Patient presents with  . Dysuria   The history is provided by the patient. No language interpreter was used.   HPI Comments: Katherine Andrews is a 23 y.o. female who presents to the Emergency Department complaining of dysuria, urinary frequency and hematuria that started 1.5 days ago ago. Also reports urinary urgency. She has not taken anything for her symptoms. Denies fever, abdominal pain, emesis, vaginal bleeding, vaginal discharge, back pain. Reports history of UTIs. Symptoms same as previous.   Past Medical History  Diagnosis Date  . UTI (lower urinary tract infection)   . BV (bacterial vaginosis)   . GERD (gastroesophageal reflux disease)   . Gonorrhea   . Chlamydia    Past Surgical History  Procedure Laterality Date  . No past surgeries     No family history on file. History  Substance Use Topics  . Smoking status: Current Every Day Smoker    Types: Cigars  . Smokeless tobacco: Not on file  . Alcohol Use: No   OB History   Grav Para Term Preterm Abortions TAB SAB Ect Mult Living   0              Review of Systems  Constitutional: Negative for fever.  HENT: Negative for congestion.   Eyes: Negative for redness.  Respiratory: Negative for shortness of breath.   Cardiovascular: Negative for chest pain.  Gastrointestinal: Negative for vomiting and abdominal pain.  Genitourinary: Positive for dysuria, frequency and hematuria. Negative for vaginal bleeding and vaginal discharge.  Musculoskeletal: Negative for back pain.  Skin: Negative for rash.  Neurological: Negative for speech difficulty.  Psychiatric/Behavioral: Negative for confusion.   Allergies   Pineapple  Home Medications   Prior to Admission medications   Not on File   BP 118/73  Pulse 65  Temp(Src) 98.1 F (36.7 C) (Oral)  Resp 18  SpO2 100%  LMP 09/27/2013  Physical Exam  Nursing note and vitals reviewed. Constitutional: She appears well-developed and well-nourished. No distress.  HENT:  Head: Normocephalic and atraumatic.  Eyes: Conjunctivae and EOM are normal.  Neck: Neck supple. No tracheal deviation present.  Cardiovascular: Normal rate.   Pulmonary/Chest: Effort normal. No respiratory distress.  Abdominal: She exhibits no distension. There is tenderness (suprapubic). There is no rebound and no guarding.  Musculoskeletal: Normal range of motion.  Neurological: She is alert.  Skin: Skin is warm and dry.  Psychiatric: She has a normal mood and affect. Her behavior is normal.    ED Course  Procedures (including critical care time)  DIAGNOSTIC STUDIES: Oxygen Saturation is 100% on RA, normal by my interpretation.    COORDINATION OF CARE: 8:41 PM-Discussed treatment plan which includes an antibiotic and pyridium with pt at bedside and pt agreed to plan.   Labs Review Labs Reviewed  URINALYSIS, ROUTINE W REFLEX MICROSCOPIC - Abnormal; Notable for the following:    APPearance TURBID (*)    Hgb urine dipstick LARGE (*)    Protein, ur 100 (*)    Leukocytes, UA MODERATE (*)    All other components within normal limits  URINE MICROSCOPIC-ADD ON - Abnormal; Notable for the following:    Squamous Epithelial / LPF  MANY (*)    Bacteria, UA MANY (*)    All other components within normal limits    Imaging Review No results found.   EKG Interpretation None      8:47 PM Patient seen and examined. UA reviewed.   Vital signs reviewed and are as follows: BP 118/73  Pulse 65  Temp(Src) 98.1 F (36.7 C) (Oral)  Resp 18  SpO2 100%  LMP 09/27/2013  Will treat for UTI.   Patient urged to return with worsening symptoms or other concerns. Patient  verbalized understanding and agrees with plan.    MDM   Final diagnoses:  Acute cystitis with hematuria   Presentation consistent with acute cystitis. No signs of pyelonephritis. No vaginal discharge to suggest STD.   I personally performed the services described in this documentation, which was scribed in my presence. The recorded information has been reviewed and is accurate.  Renne Crigler, PA-C 10/06/13 2048

## 2013-10-06 NOTE — Discharge Instructions (Signed)
Please read and follow all provided instructions.  Your diagnoses today include:  1. Acute cystitis with hematuria     Tests performed today include:  Urine test - suggests that you have an infection in your bladder  Vital signs. See below for your results today.   Medications prescribed:   Macrobid - antibiotic for urinary tract infection  You have been prescribed an antibiotic medicine: take the entire course of medicine even if you are feeling better. Stopping early can cause the antibiotic not to work.   Pyridium - medication for urinary tract infection symptoms.   This medication will turn your urine orange. This is normal.   Home care instructions:  Follow any educational materials contained in this packet.  Follow-up instructions: Please follow-up with your primary care provider in 3 days if symptoms are not resolved for further evaluation of your symptoms.  Return instructions:   Please return to the Emergency Department if you experience worsening symptoms.   Return with fever, worsening pain, persistent vomiting, worsening pain in your back.   Please return if you have any other emergent concerns.  Additional Information:  Your vital signs today were: BP 118/73   Pulse 65   Temp(Src) 98.1 F (36.7 C) (Oral)   Resp 18   SpO2 100%   LMP 09/27/2013 If your blood pressure (BP) was elevated above 135/85 this visit, please have this repeated by your doctor within one month. --------------

## 2013-12-07 ENCOUNTER — Emergency Department (HOSPITAL_COMMUNITY)
Admission: EM | Admit: 2013-12-07 | Discharge: 2013-12-07 | Disposition: A | Discharge status: Home / Self Care | Payer: Medicaid Other | Attending: Emergency Medicine | Admitting: Emergency Medicine

## 2013-12-07 ENCOUNTER — Encounter (HOSPITAL_COMMUNITY): Payer: Self-pay | Admitting: *Deleted

## 2013-12-07 DIAGNOSIS — Z8619 Personal history of other infectious and parasitic diseases: Secondary | ICD-10-CM | POA: Insufficient documentation

## 2013-12-07 DIAGNOSIS — Z79899 Other long term (current) drug therapy: Secondary | ICD-10-CM | POA: Insufficient documentation

## 2013-12-07 DIAGNOSIS — Z87440 Personal history of urinary (tract) infections: Secondary | ICD-10-CM | POA: Insufficient documentation

## 2013-12-07 DIAGNOSIS — N72 Inflammatory disease of cervix uteri: Secondary | ICD-10-CM | POA: Insufficient documentation

## 2013-12-07 DIAGNOSIS — Z3202 Encounter for pregnancy test, result negative: Secondary | ICD-10-CM | POA: Insufficient documentation

## 2013-12-07 DIAGNOSIS — Z720 Tobacco use: Secondary | ICD-10-CM | POA: Insufficient documentation

## 2013-12-07 DIAGNOSIS — Z8719 Personal history of other diseases of the digestive system: Secondary | ICD-10-CM | POA: Insufficient documentation

## 2013-12-07 LAB — URINALYSIS, ROUTINE W REFLEX MICROSCOPIC
BILIRUBIN URINE: NEGATIVE
GLUCOSE, UA: NEGATIVE mg/dL
HGB URINE DIPSTICK: NEGATIVE
Ketones, ur: NEGATIVE mg/dL
Leukocytes, UA: NEGATIVE
Nitrite: NEGATIVE
PH: 8 (ref 5.0–8.0)
Protein, ur: NEGATIVE mg/dL
SPECIFIC GRAVITY, URINE: 1.009 (ref 1.005–1.030)
Urobilinogen, UA: 0.2 mg/dL (ref 0.0–1.0)

## 2013-12-07 LAB — PREGNANCY, URINE: Preg Test, Ur: NEGATIVE

## 2013-12-07 LAB — WET PREP, GENITAL
CLUE CELLS WET PREP: NONE SEEN
TRICH WET PREP: NONE SEEN
YEAST WET PREP: NONE SEEN

## 2013-12-07 MED ORDER — LIDOCAINE HCL 2 % IJ SOLN
0.9000 mL | Freq: Once | INTRAMUSCULAR | Status: DC
  Filled 2013-12-07: qty 20

## 2013-12-07 MED ORDER — IBUPROFEN 800 MG PO TABS: 800.0000 mg | ORAL_TABLET | Freq: Three times a day (TID) | ORAL | Status: DC

## 2013-12-07 MED ORDER — HYDROCODONE-ACETAMINOPHEN 5-325 MG PO TABS: 2.0000 | ORAL_TABLET | ORAL | Status: DC | PRN

## 2013-12-07 MED ORDER — AZITHROMYCIN 250 MG PO TABS
1000.0000 mg | ORAL_TABLET | Freq: Once | ORAL | Status: AC
  Administered 2013-12-07: 1000 mg via ORAL
  Filled 2013-12-07: qty 4

## 2013-12-07 MED ORDER — LIDOCAINE HCL (PF) 1 % IJ SOLN
0.9000 mL | Freq: Once | INTRAMUSCULAR | Status: AC
  Administered 2013-12-07: 0.9 mL
  Filled 2013-12-07: qty 5

## 2013-12-07 MED ORDER — CEFTRIAXONE SODIUM 250 MG IJ SOLR
250.0000 mg | Freq: Once | INTRAMUSCULAR | Status: AC
  Administered 2013-12-07: 250 mg via INTRAMUSCULAR
  Filled 2013-12-07: qty 250

## 2013-12-07 NOTE — ED Notes (Signed)
Pt reports pain to pelvic area and rectal area x 2 days. Having vaginal discharge but denies any itching. Having back pain and headache. No acute distress noted at triage.

## 2013-12-07 NOTE — ED Provider Notes (Signed)
CSN: [id number]     Arrival date & time 12/07/13  [id number] History   First MD Initiated Contact with Patient 12/07/13 1043     Chief Complaint  Patient presents with  . Pelvic Pain     (Consider location/radiation/quality/duration/timing/severity/associated sxs/prior Treatment) Patient is a 23 y.o. female presenting with abdominal pain. The history is provided by the patient. No language interpreter was used.  Abdominal Pain Pain location:  Generalized Pain quality: aching   Pain radiates to:  Does not radiate Pain severity:  Moderate Onset quality:  Gradual Duration:  2 days Timing:  Constant Progression:  Worsening Chronicity:  New Relieved by:  Nothing Worsened by:  Nothing tried Ineffective treatments:  None tried Associated symptoms: vaginal discharge   Risk factors: not pregnant     Past Medical History  Diagnosis Date  . UTI (lower urinary tract infection)   . BV (bacterial vaginosis)   . GERD (gastroesophageal reflux disease)   . Gonorrhea   . Chlamydia    Past Surgical History  Procedure Laterality Date  . No past surgeries     History reviewed. No pertinent family history. History  Substance Use Topics  . Smoking status: Current Every Day Smoker    Types: Cigars  . Smokeless tobacco: Not on file  . Alcohol Use: No   OB History    Gravida Para Term Preterm AB TAB SAB Ectopic Multiple Living   0              Review of Systems  Gastrointestinal: Positive for abdominal pain.  Genitourinary: Positive for vaginal discharge.  All other systems reviewed and are negative.     Allergies  Pineapple  Home Medications   Prior to Admission medications   Medication Sig Start Date End Date Taking? Authorizing Provider  BIOTIN PO Take 1 tablet by mouth daily.   Yes Historical Provider, MD  Ibuprofen-Diphenhydramine HCl (ADVIL PM) 200-25 MG CAPS Take 1 tablet by mouth at bedtime as needed (for pain/sleep).   Yes Historical Provider, MD   BP 108/74 mmHg   Pulse 60  Temp(Src) 98.1 F (36.7 C) (Oral)  Resp 18  SpO2 100%  LMP 11/16/2013 Physical Exam  Constitutional: She is oriented to person, place, and time. She appears well-developed and well-nourished.  HENT:  Head: Normocephalic and atraumatic.  Right Ear: External ear normal.  Left Ear: External ear normal.  Nose: Nose normal.  Mouth/Throat: Oropharynx is clear and moist.  Eyes: Conjunctivae and EOM are normal. Pupils are equal, round, and reactive to light.  Neck: Normal range of motion.  Cardiovascular: Normal rate and normal heart sounds.   Pulmonary/Chest: Effort normal and breath sounds normal.  Abdominal: Soft. She exhibits no distension.  Genitourinary: Vaginal discharge found.  Thick white vaginal discharge.  No adnexal pain no adnexal mass   Musculoskeletal: Normal range of motion.  Neurological: She is alert and oriented to person, place, and time.  Psychiatric: She has a normal mood and affect.  Nursing note and vitals reviewed.   ED Course  Procedures (including critical care time) Labs Review Labs Reviewed  WET PREP, GENITAL  GC/CHLAMYDIA PROBE AMP  URINALYSIS, ROUTINE W REFLEX MICROSCOPIC  PREGNANCY, URINE    Imaging Review No results found.   EKG Interpretation None      MDM   Final diagnoses:  Cervicitis    Rocephin zithromax Rx for hydrocodone and ibuprofen [facility]     Elson AreasLeslie K Rachel Samples, PA-C 12/07/13 1415  Suzi RootsKevin E Steinl, MD 12/11/13 [phone]

## 2013-12-07 NOTE — Discharge Instructions (Signed)
Cervicitis °Cervicitis is a soreness and swelling (inflammation) of the cervix. Your cervix is located at the bottom of your uterus. It opens up to the vagina. °CAUSES  °· Sexually transmitted infections (STIs).   °· Allergic reaction.   °· Medicines or birth control devices that are put in the vagina.   °· Injury to the cervix.   °· Bacterial infections.   °RISK FACTORS °You are at greater risk if you: °· Have unprotected sexual intercourse. °· Have sexual intercourse with many partners. °· Began sexual intercourse at an early age. °· Have a history of STIs. °SYMPTOMS  °There may be no symptoms. If symptoms occur, they may include:  °· Gray, white, yellow, or bad-smelling vaginal discharge.   °· Pain or itching of the area outside the vagina.   °· Painful sexual intercourse.   °· Lower abdominal or lower back pain, especially during intercourse.   °· Frequent urination.   °· Abnormal vaginal bleeding between periods, after sexual intercourse, or after menopause.   °· Pressure or a heavy feeling in the pelvis.   °DIAGNOSIS  °Diagnosis is made after a pelvic exam. Other tests may include:  °· Examination of any discharge under a microscope (wet prep).   °· A Pap test.   °TREATMENT  °Treatment will depend on the cause of cervicitis. If it is caused by an STI, both you and your partner will need to be treated. Antibiotic medicines will be given.  °HOME CARE INSTRUCTIONS  °· Do not have sexual intercourse until your health care provider says it is okay.   °· Do not have sexual intercourse until your partner has been treated, if your cervicitis is caused by an STI.   °· Take your antibiotics as directed. Finish them even if you start to feel better.   °SEEK MEDICAL CARE IF: °· Your symptoms come back.   °· You have a fever.   °MAKE SURE YOU:  °· Understand these instructions. °· Will watch your condition. °· Will get help right away if you are not doing well or get worse. °Document Released: 01/08/2005 Document Revised:  01/13/2013 Document Reviewed: 07/02/2012 °Exit[employer]® Patient Information ©2015 [employer]. This information is not intended to replace advice given to you by your health care provider. Make sure you discuss any questions you have with your health care provider. ° °

## 2013-12-07 NOTE — ED Notes (Signed)
Pt ambulated to restroom and then room from triage at this time.

## 2013-12-08 LAB — GC/CHLAMYDIA PROBE AMP
CT PROBE, AMP APTIMA: POSITIVE — AB
GC Probe RNA: NEGATIVE

## 2013-12-09 ENCOUNTER — Telehealth (HOSPITAL_BASED_OUTPATIENT_CLINIC_OR_DEPARTMENT_OTHER): Payer: Self-pay | Admitting: Emergency Medicine

## 2013-12-09 NOTE — Telephone Encounter (Signed)
Post ED Visit - Positive Culture Follow-up  Culture report reviewed by antimicrobial stewardship pharmacist: []  Wes Dulaney, Pharm.D., BCPS []  Celedonio MiyamotoJeremy Frens, Pharm.D., BCPS []  Georgina PillionElizabeth Martin, [address]Ph[address]D., BCPS []  Rose CreekMinh Pham, [address] BoulevardPharm.D., BCPS, AAHIVP []  Estella HuskMichelle Turner, Pharm.D., BCPS, AAHIVP []  Babs BertinHaley Baird, Pharm.D.   Positive chlamydia culture Treated with rocephin and zithromax, organism sensitive to the same and no further patient follow-up is required at this time.  Berle MullMiller, Jamison Yuhasz 12/09/2013, 12:40 PM

## 2014-02-15 ENCOUNTER — Emergency Department (HOSPITAL_COMMUNITY)
Admission: EM | Admit: 2014-02-15 | Discharge: 2014-02-15 | Disposition: A | Discharge status: Home / Self Care | Payer: Medicaid Other | Attending: Emergency Medicine | Admitting: Emergency Medicine

## 2014-02-15 ENCOUNTER — Encounter (HOSPITAL_COMMUNITY): Payer: Self-pay | Admitting: Emergency Medicine

## 2014-02-15 DIAGNOSIS — M791 Myalgia: Secondary | ICD-10-CM | POA: Insufficient documentation

## 2014-02-15 DIAGNOSIS — R519 Headache, unspecified: Secondary | ICD-10-CM

## 2014-02-15 DIAGNOSIS — Z8719 Personal history of other diseases of the digestive system: Secondary | ICD-10-CM | POA: Insufficient documentation

## 2014-02-15 DIAGNOSIS — Z8619 Personal history of other infectious and parasitic diseases: Secondary | ICD-10-CM | POA: Insufficient documentation

## 2014-02-15 DIAGNOSIS — Z720 Tobacco use: Secondary | ICD-10-CM | POA: Insufficient documentation

## 2014-02-15 DIAGNOSIS — R51 Headache: Secondary | ICD-10-CM | POA: Insufficient documentation

## 2014-02-15 DIAGNOSIS — Z87440 Personal history of urinary (tract) infections: Secondary | ICD-10-CM | POA: Insufficient documentation

## 2014-02-15 MED ORDER — ACETAMINOPHEN 325 MG PO TABS: 650.0000 mg | ORAL_TABLET | Freq: Once | ORAL | Status: DC

## 2014-02-15 MED ORDER — DIPHENHYDRAMINE HCL 50 MG/ML IJ SOLN
25.0000 mg | INTRAMUSCULAR | Status: AC
  Administered 2014-02-15: 25 mg via INTRAVENOUS
  Filled 2014-02-15: qty 1

## 2014-02-15 MED ORDER — NAPROXEN 500 MG PO TABS: 500.0000 mg | ORAL_TABLET | Freq: Two times a day (BID) | ORAL | Status: DC

## 2014-02-15 MED ORDER — PROCHLORPERAZINE EDISYLATE 5 MG/ML IJ SOLN
10.0000 mg | Freq: Once | INTRAMUSCULAR | Status: AC
  Administered 2014-02-15: 10 mg via INTRAVENOUS
  Filled 2014-02-15: qty 2

## 2014-02-15 MED ORDER — OMEPRAZOLE 20 MG PO CPDR: 20.0000 mg | DELAYED_RELEASE_CAPSULE | Freq: Every day | ORAL | Status: DC

## 2014-02-15 MED ORDER — KETOROLAC TROMETHAMINE 30 MG/ML IJ SOLN
30.0000 mg | Freq: Once | INTRAMUSCULAR | Status: AC
  Administered 2014-02-15: 30 mg via INTRAVENOUS
  Filled 2014-02-15: qty 1

## 2014-02-15 MED ORDER — SODIUM CHLORIDE 0.9 % IV BOLUS (SEPSIS)
1000.0000 mL | Freq: Once | INTRAVENOUS | Status: AC
  Administered 2014-02-15: 1000 mL via INTRAVENOUS

## 2014-02-15 NOTE — ED Notes (Signed)
Pt c/o body aches and HA x 10 days

## 2014-02-15 NOTE — ED Provider Notes (Signed)
CSN: [id number]     Arrival date & time 02/15/14  1318 History   First MD Initiated Contact with Patient 02/15/14 1611     Chief Complaint  Patient presents with  . Headache  . Generalized Body Aches    (Consider location/radiation/quality/duration/timing/severity/associated sxs/prior Treatment) HPI Katherine Andrews is a 24 year old female presenting with generalized body pain and headache worse 3-4 days. She states this pain has been ongoing for "a while."  She is not sure what is her most bothersome symptom but states "everything hurts". She complains of a gradual onset right-sided headache 3 days. She has a history of headaches and this headache is not unlike her previous headaches. She endorses some nausea with the headache. She endorses some intermittent chest pain that has been evaluated in the past and diagnosed as acid reflux, however she is not taken any medicine that was prescribed for her acid reflux. She denies any current chest pain. She endorses feeling warm at home but has no documented fever she denies focal neuro deficit, cough, vomiting, diarrhea, abdominal pain, vaginal discharge or dysuria.  Past Medical History  Diagnosis Date  . UTI (lower urinary tract infection)   . BV (bacterial vaginosis)   . GERD (gastroesophageal reflux disease)   . Gonorrhea   . Chlamydia    Past Surgical History  Procedure Laterality Date  . No past surgeries     History reviewed. No pertinent family history. History  Substance Use Topics  . Smoking status: Current Every Day Smoker    Types: Cigars  . Smokeless tobacco: Not on file  . Alcohol Use: No   OB History    Gravida Para Term Preterm AB TAB SAB Ectopic Multiple Living   0              Review of Systems  Constitutional: Negative for fever and chills.  HENT: Negative for sore throat.   Eyes: Negative for visual disturbance.  Respiratory: Negative for cough and shortness of breath.   Cardiovascular: Negative for leg  swelling.  Gastrointestinal: Negative for nausea, vomiting and diarrhea.  Genitourinary: Negative for dysuria.  Musculoskeletal: Positive for myalgias.  Skin: Negative for rash.  Neurological: Positive for headaches. Negative for weakness and numbness.    Allergies  Pineapple  Home Medications   Prior to Admission medications   Medication Sig Start Date End Date Taking? Authorizing Provider  BIOTIN PO Take 1 tablet by mouth daily.    Historical Provider, MD  HYDROcodone-acetaminophen (NORCO/VICODIN) 5-325 MG per tablet Take 2 tablets by mouth every 4 (four) hours as needed for moderate pain or severe pain. 12/07/13   Elson AreasLeslie K Sofia, PA-C  ibuprofen (ADVIL,MOTRIN) 800 MG tablet Take 1 tablet (800 mg total) by mouth 3 (three) times daily. 12/07/13   Elson AreasLeslie K Sofia, PA-C  Ibuprofen-Diphenhydramine HCl (ADVIL PM) 200-25 MG CAPS Take 1 tablet by mouth at bedtime as needed (for pain/sleep).    Historical Provider, MD   BP 97/68 mmHg  Pulse 68  Temp(Src) 98.2 F (36.8 C) (Oral)  Resp 25  Ht 5\' 2"  (1.575 m)  SpO2 100% Physical Exam  Constitutional: She is oriented to person, place, and time. She appears well-developed and well-nourished. No distress.  HENT:  Head: Normocephalic and atraumatic.  Mouth/Throat: Oropharynx is clear and moist. No oropharyngeal exudate.  Eyes: Conjunctivae are normal.  Neck: Neck supple. No thyromegaly present.  Cardiovascular: Normal rate, regular rhythm and intact distal pulses.   Pulmonary/Chest: Effort normal. No respiratory distress. She has no  decreased breath sounds. She has no wheezes. She has no rhonchi. She has no rales. She exhibits no tenderness.  Abdominal: Soft. There is no tenderness.  Musculoskeletal: She exhibits no tenderness.  Lymphadenopathy:    She has no cervical adenopathy.  Neurological: She is alert and oriented to person, place, and time. She has normal strength. No cranial nerve deficit or sensory deficit. Coordination normal. GCS  eye subscore is 4. GCS verbal subscore is 5. GCS motor subscore is 6.  Cranial nerves 2-12 intact  Skin: Skin is warm and dry. No rash noted. She is not diaphoretic.  Psychiatric: She has a normal mood and affect.  Nursing note and vitals reviewed.   ED Course  Procedures (including critical care time) Labs Review Labs Reviewed - No data to display  Imaging Review No results found.   EKG Interpretation None      MDM   Final diagnoses:  Nonintractable headache, unspecified chronicity pattern, unspecified headache type   24 yo with several ongoing complaints, most bothersome is a headache that was gradual in onset and not concerning for [facility], ICH, Meningitis, or temporal arteritis. She is afebrile with no focal neuro deficits, nuchal rigidity, or change in vision. NS bolus with headache cocktail meds, pt reports improvement of symptoms.  Resources provided to establish care with PCP for follow-up. Pt verbalizes understanding and is agreeable with plan to dc.    Filed Vitals:   02/15/14 1800 02/15/14 1815 02/15/14 1830 02/15/14 1845  BP: 106/62 104/64 108/72 115/67  Pulse: 64 62 64 70  Temp:      TempSrc:      Resp: Height:      SpO2: 100% 99% 100% 100%    Meds given in ED:  Medications  sodium chloride 0.9 % bolus 1,000 mL (0 mLs Intravenous Stopped 02/15/14 1855)  ketorolac (TORADOL) 30 MG/ML injection 30 mg (30 mg Intravenous Given 02/15/14 1747)  prochlorperazine (COMPAZINE) injection 10 mg (10 mg Intravenous Given 02/15/14 1747)  diphenhydrAMINE (BENADRYL) injection 25 mg (25 mg Intravenous Given 02/15/14 1747)    Discharge Medication List as of 02/15/2014  6:50 PM    START taking these medications   Details  naproxen (NAPROSYN) 500 MG tablet Take 1 tablet (500 mg total) by mouth 2 (two) times daily., Starting 02/15/2014, Until Discontinued, Print    omeprazole (PRILOSEC) 20 MG capsule Take 1 capsule (20 mg total) by mouth daily., Starting 02/15/2014,  Until Discontinued, Print          Harle Battiest, NP 02/17/14 1610  Hilario Quarry, MD 02/18/14 [id number]

## 2014-02-15 NOTE — ED Notes (Signed)
Patient reports and seeks reinforcement from her bedside visitor that she has had a headache for the past 2-2.5 days, and generalized body aches for the same amount. She reports she has not tried any otc (tylenol, ibuprofen etc) for her headache or body aches. She reports she has not checked her temp. She denies photophobia. No reported n/v/d. Pt laughs when asked for details ie taken meds, called md etc.

## 2014-02-15 NOTE — Discharge Instructions (Signed)
Please follow directions provided. Use the resource guide or referral given to establish care with a primary care doctor to ensure you're getting better. Take the naproxen twice a day for pain and the omeprazole 1 time a day as needed for reflux symptoms. Don't hesitate to return for any new, worsening, or concerning symptoms.   SEEK IMMEDIATE MEDICAL CARE IF:  Your headache becomes severe.  You have a fever.  You have a stiff neck.  You have loss of vision.  You have muscular weakness or loss of muscle control.  You start losing your balance or have trouble walking.  You feel faint or pass out.  You have severe symptoms that are different from your first symptoms.    Emergency Department Resource Guide 1) Find a Doctor and Pay Out of Pocket Although you won't have to find out who is covered by your insurance plan, it is a good idea to ask around and get recommendations. You will then need to call the office and see if the doctor you have chosen will accept you as a new patient and what types of options they offer for patients who are self-pay. Some doctors offer discounts or will set up payment plans for their patients who do not have insurance, but you will need to ask so you aren't surprised when you get to your appointment.  2) Contact Your Local Health Department Not all health departments have doctors that can see patients for sick visits, but many do, so it is worth a call to see if yours does. If you don't know where your local health department is, you can check in your phone book. The CDC also has a tool to help you locate your state's health department, and many state websites also have listings of all of their local health departments.  3) Find a Walk-in Clinic If your illness is not likely to be very severe or complicated, you may want to try a walk in clinic. These are popping up all over the country in pharmacies, drugstores, and shopping centers. They're usually staffed by  nurse practitioners or physician assistants that have been trained to treat common illnesses and complaints. They're usually fairly quick and inexpensive. However, if you have serious medical issues or chronic medical problems, these are probably not your best option.  No Primary Care Doctor: - Call Health Connect at  [phone] - they can help you locate a primary care doctor that  accepts your insurance, provides certain services, etc. - Physician Referral Service- [phone]  Chronic Pain Problems: Organization         Address  Phone   Notes  [facility]  [phone] Patients need to be referred by their primary care doctor.   Medication Assistance: Organization         Address  Phone   Notes  [facility] [address] [place], [place] [address] [phone] --Must be a resident of [facility] -- Must have NO insurance coverage whatsoever (no Medicaid/ Medicare, etc.) -- The pt. MUST have a primary care doctor that directs their care regularly and follows them in the community   [facility]ist  [phone]   [employer]  [phone]    Agencies that provide inexpensive medical care: Organization         Address  Phone   Notes  [facility]  [phone]   [facility]    [phone]  [phone]   [facility] [address] [place], [place] [address] [phone]   [facility] [address] [place], [place] [phone]   Planned Parenthood    [phone]   [facility]    [phone]   Community Health and [facility]  [address], [place] Phone:  [phone], Fax:  [phone] Hours of Operation:  9 am - 6 pm, M-F.  Also accepts Medicaid/Medicare and self-pay.  [facility]  [address], [place] Phone: [phone], Fax: [phone]. Hours of Operation:  8:30  am - 5:30 pm, M-F.  Also accepts Medicaid and self-pay.  [facility] [place] [address] [place] Phone: [phone]   [facility] [address] Natasha Bence [place], [place] [phone], Ext. [phone] Mondays & Thursdays: 7-9 AM.  First 15 patients are seen on a first come, first serve basis.    Medicaid-accepting [facility] Providers:  Organization         Address  Phone   Notes  [facility] [address], [place] [phone] Also accepts self-pay patients.  [facility] [address] Laurell Josephs [place], [place]  [phone]   [facility] [address], [place] [phone]   [facility] [address], [place] [phone]   Renaye Rakers [address], [place]   [phone] Only accepts [place] Access [place] patients after they have their name applied to their card.   Self-Pay (no insurance) in [facility]:  Organization         Address  Phone   Notes  Sickle Cell Patients, [facility] [address] [place], [place] [phone]   [facility] [address] [place], [place] [phone]   [facility] [place]  [address], [place] [phone]   [facility]/Dr. Osei-Bonsu  [address], [place] or [address], [place] [phone] Phone number for both [place] and [place] locations is the same.  [facility] [address], [place] [phone]   [facility] [address], [place] or [address] [phone]   [facility] [address] [place] [phone], phone; [phone], fax Sees patients 1st and 3rd Saturday of every month.  Must not qualify for public or private insurance (i.e. Medicaid, Medicare, [place] Health  Choice, Veterans' Benefits)  Household income should be no more than 200% of the poverty level The clinic cannot treat you if you are pregnant or think you are pregnant  Sexually transmitted diseases are not treated at the clinic.    Dental Care: Organization         Address  Phone  Notes  [facility] Department of [facility] [address] [place], [place] [phone] Accepts children up to age 65 who are enrolled in [place] or [place] Health Choice; pregnant women with a Medicaid card; and children who have applied for Medicaid or  Health Choice, but were declined, whose parents can pay a reduced fee at time of service.  [facility]  [address], [place] [phone] Accepts children up to  age 22 who are enrolled in Medicaid or [place] Health Choice; pregnant women with a Medicaid card; and children who have applied for Medicaid or [place] Health Choice, but were declined, whose parents can pay a reduced fee at time of service.  [facility]  [address], [place] [phone] Patients are seen by appointment only. Walk-ins are not accepted. [facility] will see patients 22 years of age and older. Monday - Tuesday (8am-5pm) Most Wednesdays (8:30-5pm) $30 per visit, cash only  [facility]  [address], [facility] [place] [phone] Patients are seen by appointment only. Walk-ins are not accepted. [facility] will see patients 818 years of age and older. One Wednesday Evening (Monthly: Volunteer Based).  $30 per visit, cash only  [employer] [facility]  [phone]) [phone] for adults; Children under age 814, call Graduate Pediatric Dentistry at [phone]. Children aged 814-14, please call [phone]) [phone] to request a pediatric application.  Dental services are provided in all areas of dental care including fillings, crowns and bridges, complete  and partial dentures, implants, gum treatment, root canals, and extractions. Preventive care is also provided. Treatment is provided to both adults and children. Patients are selected via a lottery and there is often a waiting list.   [facility] [address], [place]  [phone] [email or web address]   [facility] [address], [place], [place] [phone], Ext. [phone] Second and Fourth Thursday of each month, opens at 6:30 AM; Clinic ends at 9 AM.  Patients are seen on a first-come first-served basis, and a limited number are seen during each clinic.   [facility]  [address], [place], [place] [phone]   Eligibility Requirements You must have lived in [place], [place], or [place] counties for at least the last three months.   You cannot be eligible for state or federal sponsored National Cityhealthcare insurance, including CIGNAVeterans Administration, [place]icaid, or [employer]icare.   You generally cannot be eligible for healthcare insurance through your employer.    How to apply: Eligibility screenings are held every Tuesday and Wednesday afternoon from 1:00 pm until 4:00 pm. You do not need an appointment for the interview!  [facility] [address], [place], [place] [phone]   [facility]  [phone]   [facility]  [phone]   [facility]  [phone]    Behavioral Health Resources in the Community: Intensive Outpatient Programs Organization         Address  Phone  Notes  [facility] [address], [place], [place] [phone]   [facility] [address], [place], [place] [phone]   [facility] [address], [place], [place]  [phone]   [facility] [address],  [place], [place] [phone] or [phone]   Substance Abuse Resources Organization          Address  Phone  Notes  [facility] Drug Services  [phone]   [facility]  [phone]   [facility]  [phone]   [facility]  [phone]   Residential & Outpatient Substance Abuse Program  [phone]   Psychological Services Organization         Address  Phone  Notes  [facility]  [phone]- [phone]   [facility]  [phone]- [phone]   [facility] [phone]  [address], [place] or [phone]    Mobile Crisis Teams Organization         Address  Phone  Notes  [facility], Mobile Crisis Care Unit  [phone]   [facility]  [address] [place], [place]   [place] Levels [address] [place] [phone]    Self-Help/Support Groups Organization         Address  Phone             Notes  [place]. of [place] - variety of support groups  [place] Call for more information  Narcotics Anonymous (NA), [facility] [address], [employer] [place]  2 meetings at this location   Special educational needs teacher         Address  Phone  Notes  [facility] [place],    [place]  [phone]   [place]  [address] [place] [phone], [place], [place]   [place], [place] [phone] Admissions: 8am-3pm M-F  Incentives Substance [place] [address],    [place], [place] [phone]   [facility] [address] [place], [place], [place]   [facility] [address],  [place], [place]   [facility] - Intensive Outpatient [address], [place], [place]   [facility] ([place].,  [place], [place] [phone] or [phone]   [facility] (RTS) [address] [place], [place] Accepts Medicaid  [facility] [place] [address]  [place] [phone] Substance Abuse/Addiction Treatment   [facility] Organization         Address  Phone  Notes  [facility]  [phone]   Domenic Schwab, PhD [address] Arlis Porta [place], [place]   [phone] or [phone]   [place], [place] [phone]   [facility] [address] [place], [place] [phone] Insurance/Medicaid/sponsorship through [facility] and Families [address] [place]                                    [place], [place] [phone] [place] [address], [place] [phone]    Dr. Adele Schilder  [phone]   [facility] Dept. 1) [address] [place] 2) [place] 3)  [place] [address], [place] [phone]  [phone]   [place] [phone] or [phone] (After Hours)

## 2014-05-15 ENCOUNTER — Emergency Department (HOSPITAL_COMMUNITY)
Admission: EM | Admit: 2014-05-15 | Discharge: 2014-05-15 | Disposition: A | Discharge status: Home / Self Care | Payer: Medicaid Other | Attending: Emergency Medicine | Admitting: Emergency Medicine

## 2014-05-15 ENCOUNTER — Encounter (HOSPITAL_COMMUNITY): Payer: Self-pay

## 2014-05-15 DIAGNOSIS — Z8619 Personal history of other infectious and parasitic diseases: Secondary | ICD-10-CM | POA: Insufficient documentation

## 2014-05-15 DIAGNOSIS — Z720 Tobacco use: Secondary | ICD-10-CM | POA: Insufficient documentation

## 2014-05-15 DIAGNOSIS — N644 Mastodynia: Secondary | ICD-10-CM

## 2014-05-15 DIAGNOSIS — Z3202 Encounter for pregnancy test, result negative: Secondary | ICD-10-CM | POA: Insufficient documentation

## 2014-05-15 DIAGNOSIS — K219 Gastro-esophageal reflux disease without esophagitis: Secondary | ICD-10-CM | POA: Insufficient documentation

## 2014-05-15 DIAGNOSIS — Z87440 Personal history of urinary (tract) infections: Secondary | ICD-10-CM | POA: Insufficient documentation

## 2014-05-15 LAB — URINE MICROSCOPIC-ADD ON

## 2014-05-15 LAB — URINALYSIS, ROUTINE W REFLEX MICROSCOPIC
BILIRUBIN URINE: NEGATIVE
Glucose, UA: NEGATIVE mg/dL
KETONES UR: NEGATIVE mg/dL
Nitrite: NEGATIVE
Protein, ur: NEGATIVE mg/dL
SPECIFIC GRAVITY, URINE: 1.022 (ref 1.005–1.030)
UROBILINOGEN UA: 1 mg/dL (ref 0.0–1.0)
pH: 7.5 (ref 5.0–8.0)

## 2014-05-15 LAB — POC URINE PREG, ED: PREG TEST UR: NEGATIVE

## 2014-05-15 NOTE — ED Notes (Signed)
Pt given sandwich and water.

## 2014-05-15 NOTE — ED Notes (Signed)
Pt reports pain starting 2 days ago in bilateral breasts, swelling to right side, red spot, brown discharge to left breast.

## 2014-05-15 NOTE — Discharge Instructions (Signed)

## 2014-05-15 NOTE — ED Provider Notes (Signed)
CSN: [id number]     Arrival date & time 05/15/14  1441 History   First MD Initiated Contact with Patient 05/15/14 1515     Chief Complaint  Patient presents with  . Breast Pain    Patient is a 24 y.o. female presenting with general illness. The history is provided by the patient.  Illness Location:  Bilateral Breasts Quality:  Soreness and brown discharge from nipple of R breast; Redness and tenderness of medial aspect of left breast Severity:  Moderate Onset quality:  Gradual Duration:  4 days Timing:  Constant Progression:  Worsening Chronicity:  New Context:  Patient reports she has been trying to get pregnant; no trauma, no prior history of same Relieved by:  Nothing Worsened by:  Palpation Ineffective treatments:  None tried Associated symptoms: fever and nausea   Associated symptoms: no abdominal pain, no cough, no rash, no shortness of breath and no vomiting   Fever:    Temp source:  Subjective   Progression:  Resolved Nausea:    Severity:  Mild   Onset quality:  Gradual   Duration:  4 days   Timing:  Intermittent   Past Medical History  Diagnosis Date  . UTI (lower urinary tract infection)   . BV (bacterial vaginosis)   . GERD (gastroesophageal reflux disease)   . Gonorrhea   . Chlamydia    Past Surgical History  Procedure Laterality Date  . No past surgeries     History reviewed. No pertinent family history. History  Substance Use Topics  . Smoking status: Current Every Day Smoker    Types: Cigars  . Smokeless tobacco: Not on file  . Alcohol Use: No   OB History    Gravida Para Term Preterm AB TAB SAB Ectopic Multiple Living   0              Review of Systems  Constitutional: Positive for fever. Negative for chills.  HENT: Negative for trouble swallowing.   Eyes: Negative for redness.  Respiratory: Negative for cough and shortness of breath.   Gastrointestinal: Positive for nausea. Negative for vomiting and abdominal pain.  Genitourinary:  Negative for dysuria, frequency and pelvic pain.  Musculoskeletal: Negative for gait problem.  Skin: Negative for rash.  Neurological: Negative for speech difficulty.  Psychiatric/Behavioral: Negative for confusion.    Allergies  Pineapple  Home Medications   Prior to Admission medications   Medication Sig Start Date End Date Taking? Authorizing Provider  HYDROcodone-acetaminophen (NORCO/VICODIN) 5-325 MG per tablet Take 2 tablets by mouth every 4 (four) hours as needed for moderate pain or severe pain. Patient not taking: Reported on 05/15/2014 12/07/13   Elson AreasLeslie K Sofia, PA-C  ibuprofen (ADVIL,MOTRIN) 800 MG tablet Take 1 tablet (800 mg total) by mouth 3 (three) times daily. Patient not taking: Reported on 05/15/2014 12/07/13   Elson AreasLeslie K Sofia, PA-C  naproxen (NAPROSYN) 500 MG tablet Take 1 tablet (500 mg total) by mouth 2 (two) times daily. Patient not taking: Reported on 05/15/2014 02/15/14   Harle BattiestElizabeth Tysinger, NP  omeprazole (PRILOSEC) 20 MG capsule Take 1 capsule (20 mg total) by mouth daily. Patient not taking: Reported on 05/15/2014 02/15/14   Harle BattiestElizabeth Tysinger, NP   BP 119/83 mmHg  Pulse 89  Temp(Src) 98.7 F (37.1 C)  Resp 17  Ht 5' 2.5" (1.588 m)  SpO2 98%  LMP   Physical Exam  Constitutional: She is oriented to person, place, and time. She appears well-developed and well-nourished. She is cooperative. No distress.  HENT:  Head: Normocephalic and atraumatic.  Right Ear: External ear normal.  Left Ear: External ear normal.  Neck: Normal range of motion and phonation normal.  Cardiovascular: Normal rate and regular rhythm.   No peripheral edema  Pulmonary/Chest: Effort normal and breath sounds normal. No respiratory distress. She has no wheezes. She has no rales.    Chaperone present for breast exam; Right breast diffusely mildly tender to palpation - no discharge; left breast with medial area of tenderness - see graphical documentation  Abdominal: Soft. She exhibits  no distension. There is no tenderness. There is no rebound and no guarding.  Neurological: She is alert and oriented to person, place, and time.  Skin: Skin is warm and dry. No rash noted. She is not diaphoretic.  Psychiatric: She has a normal mood and affect.    ED Course  Procedures (including critical care time) Labs Review Labs Reviewed  URINALYSIS, ROUTINE W REFLEX MICROSCOPIC - Abnormal; Notable for the following:    APPearance TURBID (*)    Hgb urine dipstick TRACE (*)    Leukocytes, UA LARGE (*)    All other components within normal limits  URINE MICROSCOPIC-ADD ON - Abnormal; Notable for the following:    Squamous Epithelial / LPF MANY (*)    Bacteria, UA MANY (*)    All other components within normal limits  POC URINE PREG, ED    MDM   Final diagnoses:  Breast pain in female    24 year old female with a past medical history of UTI, BV, GERD, gonorrhea, and chlamydia presents to the ED due to bilateral breast pain. She reports a reddened area on the left breast that has been painful for 4 days. She reports her entire right breast is tender as of this morning when she woke up and she noted brown discharge from her nipple. She reports that she could be pregnant, as she has been trying to get pregnant. Endorses nausea. She reports feeling subjectively febrile for the last 3-4 days. Denies chills, abdominal pain, diarrhea. She has no other concerns today.  Exam as above. Young female in no distress. No evidence of abscess or cellulitis on either breast. No definitive palpable masses. No nipple discharge.  No concern for abscess or cellulitis. She is not pregnant and her urine is contaminated. She has no symptoms of a UTI.  Return precautions were discussed and given writing. She is to follow-up with the breast clinic for a mammogram. Contact information was provided.  This patient was managed in conjunction with my attending, Dr. Margarita Grizzle.    Maxine Glenn, MD 05/15/14  2133  Margarita Grizzle, MD 05/15/14 [phone]

## 2014-05-15 NOTE — ED Notes (Signed)
Pt here for breast pain for the past 3-4 days. Noticed brown discharge from the right breast this morning and that it was slightly larger than the left. Has irregular menses and not sure when her last one was. States she is not pregnant.

## 2014-05-21 ENCOUNTER — Other Ambulatory Visit: Payer: Medicaid Other

## 2014-05-21 ENCOUNTER — Other Ambulatory Visit (HOSPITAL_COMMUNITY): Payer: Self-pay | Admitting: *Deleted

## 2014-05-21 DIAGNOSIS — N644 Mastodynia: Secondary | ICD-10-CM

## 2014-05-21 DIAGNOSIS — N63 Unspecified lump in unspecified breast: Secondary | ICD-10-CM

## 2014-05-21 DIAGNOSIS — N6452 Nipple discharge: Secondary | ICD-10-CM

## 2014-06-03 ENCOUNTER — Inpatient Hospital Stay: Admission: RE | Admit: 2014-06-03 | Payer: Medicaid Other | Source: Ambulatory Visit

## 2014-06-03 ENCOUNTER — Ambulatory Visit (HOSPITAL_COMMUNITY): Payer: Medicaid Other | Attending: Obstetrics and Gynecology

## 2014-11-28 ENCOUNTER — Encounter (HOSPITAL_COMMUNITY): Payer: Self-pay | Admitting: Emergency Medicine

## 2014-11-28 ENCOUNTER — Emergency Department (HOSPITAL_COMMUNITY)
Admission: EM | Admit: 2014-11-28 | Discharge: 2014-11-29 | Disposition: A | Discharge status: Home / Self Care | Payer: No Typology Code available for payment source | Attending: Emergency Medicine | Admitting: Emergency Medicine

## 2014-11-28 DIAGNOSIS — Z8619 Personal history of other infectious and parasitic diseases: Secondary | ICD-10-CM | POA: Diagnosis not present

## 2014-11-28 DIAGNOSIS — Y998 Other external cause status: Secondary | ICD-10-CM | POA: Insufficient documentation

## 2014-11-28 DIAGNOSIS — Z87440 Personal history of urinary (tract) infections: Secondary | ICD-10-CM | POA: Insufficient documentation

## 2014-11-28 DIAGNOSIS — Y9389 Activity, other specified: Secondary | ICD-10-CM | POA: Diagnosis not present

## 2014-11-28 DIAGNOSIS — Z720 Tobacco use: Secondary | ICD-10-CM | POA: Diagnosis not present

## 2014-11-28 DIAGNOSIS — S86812A Strain of other muscle(s) and tendon(s) at lower leg level, left leg, initial encounter: Secondary | ICD-10-CM | POA: Diagnosis not present

## 2014-11-28 DIAGNOSIS — K219 Gastro-esophageal reflux disease without esophagitis: Secondary | ICD-10-CM | POA: Diagnosis not present

## 2014-11-28 DIAGNOSIS — Y92410 Unspecified street and highway as the place of occurrence of the external cause: Secondary | ICD-10-CM | POA: Diagnosis not present

## 2014-11-28 DIAGNOSIS — S8392XA Sprain of unspecified site of left knee, initial encounter: Secondary | ICD-10-CM

## 2014-11-28 DIAGNOSIS — S8992XA Unspecified injury of left lower leg, initial encounter: Secondary | ICD-10-CM | POA: Diagnosis present
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
## 2014-11-29 ENCOUNTER — Emergency Department (HOSPITAL_COMMUNITY): Payer: No Typology Code available for payment source

## 2014-11-29 MED ORDER — OXYCODONE-ACETAMINOPHEN 5-325 MG PO TABS
1.0000 | ORAL_TABLET | Freq: Once | ORAL | Status: AC
  Administered 2014-11-29: 1 via ORAL
  Filled 2014-11-29: qty 1

## 2014-11-29 NOTE — ED Notes (Signed)
Patient verbalized understanding of discharge instructions, no further questions at this time. VS stable. 

## 2014-11-29 NOTE — ED Notes (Signed)
MD at bedside. 
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
## 2014-12-12 ENCOUNTER — Emergency Department (HOSPITAL_COMMUNITY): Payer: Medicaid Other

## 2014-12-12 ENCOUNTER — Encounter (HOSPITAL_COMMUNITY): Payer: Self-pay | Admitting: Emergency Medicine

## 2014-12-12 ENCOUNTER — Emergency Department (HOSPITAL_COMMUNITY)
Admission: EM | Admit: 2014-12-12 | Discharge: 2014-12-12 | Disposition: A | Discharge status: Home / Self Care | Payer: Self-pay | Attending: Emergency Medicine | Admitting: Emergency Medicine

## 2014-12-12 DIAGNOSIS — Z8742 Personal history of other diseases of the female genital tract: Secondary | ICD-10-CM | POA: Insufficient documentation

## 2014-12-12 DIAGNOSIS — M79672 Pain in left foot: Secondary | ICD-10-CM | POA: Insufficient documentation

## 2014-12-12 DIAGNOSIS — Z79899 Other long term (current) drug therapy: Secondary | ICD-10-CM | POA: Insufficient documentation

## 2014-12-12 DIAGNOSIS — R52 Pain, unspecified: Secondary | ICD-10-CM

## 2014-12-12 DIAGNOSIS — K219 Gastro-esophageal reflux disease without esophagitis: Secondary | ICD-10-CM | POA: Insufficient documentation

## 2014-12-12 DIAGNOSIS — F419 Anxiety disorder, unspecified: Secondary | ICD-10-CM | POA: Insufficient documentation

## 2014-12-12 DIAGNOSIS — Z87440 Personal history of urinary (tract) infections: Secondary | ICD-10-CM | POA: Insufficient documentation

## 2014-12-12 DIAGNOSIS — Z8619 Personal history of other infectious and parasitic diseases: Secondary | ICD-10-CM | POA: Insufficient documentation

## 2014-12-12 DIAGNOSIS — F17210 Nicotine dependence, cigarettes, uncomplicated: Secondary | ICD-10-CM | POA: Insufficient documentation

## 2014-12-12 DIAGNOSIS — R000 Tachycardia, unspecified: Secondary | ICD-10-CM | POA: Insufficient documentation

## 2014-12-12 MED ORDER — LORAZEPAM 2 MG/ML IJ SOLN
INTRAMUSCULAR | Status: AC
  Filled 2014-12-12: qty 1

## 2014-12-12 MED ORDER — LORAZEPAM 2 MG/ML IJ SOLN
2.0000 mg | Freq: Once | INTRAMUSCULAR | Status: AC
  Administered 2014-12-12: 2 mg via INTRAVENOUS

## 2014-12-12 MED ORDER — SODIUM CHLORIDE 0.9 % IV BOLUS (SEPSIS)
1000.0000 mL | Freq: Once | INTRAVENOUS | Status: AC
  Administered 2014-12-12: 1000 mL via INTRAVENOUS

## 2014-12-12 NOTE — ED Notes (Signed)
Patient alert and oriented at this time.  Discharged instructions provided.  Verbalizes understanding.

## 2014-12-12 NOTE — ED Notes (Signed)
Patient up for discharge.  Continues to be very drowsy at this time.  Unable to reach a ride.  To continue to monitor.

## 2014-12-12 NOTE — ED Notes (Signed)
Ambulated in hall at this time.  Able to walk to Pod E double doors and back.  States the pain is better.  Now rating at 4/10.

## 2014-12-12 NOTE — ED Notes (Signed)
Trying to call a ride at this time.
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
## 2015-04-12 ENCOUNTER — Emergency Department (HOSPITAL_COMMUNITY)
Admission: EM | Admit: 2015-04-12 | Discharge: 2015-04-12 | Disposition: A | Discharge status: Home / Self Care | Payer: No Typology Code available for payment source | Attending: Emergency Medicine | Admitting: Emergency Medicine

## 2015-04-12 ENCOUNTER — Encounter (HOSPITAL_COMMUNITY): Payer: Self-pay | Admitting: Emergency Medicine

## 2015-04-12 DIAGNOSIS — M25572 Pain in left ankle and joints of left foot: Secondary | ICD-10-CM | POA: Insufficient documentation

## 2015-04-12 DIAGNOSIS — F17210 Nicotine dependence, cigarettes, uncomplicated: Secondary | ICD-10-CM | POA: Insufficient documentation
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
## 2015-04-12 NOTE — ED Notes (Signed)
Patient came up to desk and stated she was leaving

## 2015-05-08 ENCOUNTER — Encounter (HOSPITAL_COMMUNITY): Payer: Self-pay | Admitting: Emergency Medicine

## 2015-05-08 ENCOUNTER — Emergency Department (HOSPITAL_COMMUNITY): Payer: Self-pay

## 2015-05-08 ENCOUNTER — Emergency Department (HOSPITAL_COMMUNITY)
Admission: EM | Admit: 2015-05-08 | Discharge: 2015-05-09 | Disposition: A | Discharge status: Home / Self Care | Payer: Self-pay | Attending: Emergency Medicine | Admitting: Emergency Medicine

## 2015-05-08 DIAGNOSIS — Z3202 Encounter for pregnancy test, result negative: Secondary | ICD-10-CM | POA: Insufficient documentation

## 2015-05-08 DIAGNOSIS — Z8719 Personal history of other diseases of the digestive system: Secondary | ICD-10-CM | POA: Insufficient documentation

## 2015-05-08 DIAGNOSIS — N730 Acute parametritis and pelvic cellulitis: Secondary | ICD-10-CM | POA: Insufficient documentation

## 2015-05-08 DIAGNOSIS — Z87440 Personal history of urinary (tract) infections: Secondary | ICD-10-CM | POA: Insufficient documentation

## 2015-05-08 DIAGNOSIS — F17210 Nicotine dependence, cigarettes, uncomplicated: Secondary | ICD-10-CM | POA: Insufficient documentation

## 2015-05-08 DIAGNOSIS — R102 Pelvic and perineal pain: Secondary | ICD-10-CM

## 2015-05-08 DIAGNOSIS — Z8619 Personal history of other infectious and parasitic diseases: Secondary | ICD-10-CM | POA: Insufficient documentation

## 2015-05-08 DIAGNOSIS — N898 Other specified noninflammatory disorders of vagina: Secondary | ICD-10-CM

## 2015-05-08 LAB — LIPASE, BLOOD: LIPASE: 22 U/L (ref 11–51)

## 2015-05-08 LAB — COMPREHENSIVE METABOLIC PANEL
ALBUMIN: 3.8 g/dL (ref 3.5–5.0)
ALT: 19 U/L (ref 14–54)
ANION GAP: 10 (ref 5–15)
AST: 24 U/L (ref 15–41)
Alkaline Phosphatase: 59 U/L (ref 38–126)
BUN: 15 mg/dL (ref 6–20)
CO2: 24 mmol/L (ref 22–32)
Calcium: 9.3 mg/dL (ref 8.9–10.3)
Chloride: 104 mmol/L (ref 101–111)
Creatinine, Ser: 0.75 mg/dL (ref 0.44–1.00)
GFR calc Af Amer: >60 mL/min (ref >60)
GFR calc non Af Amer: >60 mL/min (ref >60)
GLUCOSE: 109 mg/dL — AB (ref 65–99)
POTASSIUM: 4.1 mmol/L (ref 3.5–5.1)
Sodium: 138 mmol/L (ref 135–145)
Total Bilirubin: 0.5 mg/dL (ref 0.3–1.2)
Total Protein: 7.4 g/dL (ref 6.5–8.1)

## 2015-05-08 LAB — URINALYSIS, ROUTINE W REFLEX MICROSCOPIC
Bilirubin Urine: NEGATIVE
Glucose, UA: NEGATIVE mg/dL
Ketones, ur: NEGATIVE mg/dL
Nitrite: NEGATIVE
PH: 6 (ref 5.0–8.0)
Protein, ur: NEGATIVE mg/dL
SPECIFIC GRAVITY, URINE: 1.028 (ref 1.005–1.030)

## 2015-05-08 LAB — CBC
HEMATOCRIT: 37.6 % (ref 36.0–46.0)
HEMOGLOBIN: 12.6 g/dL (ref 12.0–15.0)
MCH: 29 pg (ref 26.0–34.0)
MCHC: 33.5 g/dL (ref 30.0–36.0)
MCV: 86.6 fL (ref 78.0–100.0)
Platelets: 302 10*3/uL (ref 150–400)
RBC: 4.34 MIL/uL (ref 3.87–5.11)
RDW: 13.5 % (ref 11.5–15.5)
WBC: 8.7 10*3/uL (ref 4.0–10.5)

## 2015-05-08 LAB — WET PREP, GENITAL
CLUE CELLS WET PREP: NONE SEEN
Sperm: NONE SEEN
Trich, Wet Prep: NONE SEEN

## 2015-05-08 LAB — URINE MICROSCOPIC-ADD ON

## 2015-05-08 LAB — POC URINE PREG, ED: PREG TEST UR: NEGATIVE

## 2015-05-08 MED ORDER — ONDANSETRON 4 MG PO TBDP
8.0000 mg | ORAL_TABLET | Freq: Once | ORAL | Status: AC
  Administered 2015-05-08: 8 mg via ORAL
  Filled 2015-05-08: qty 2

## 2015-05-08 MED ORDER — AZITHROMYCIN 250 MG PO TABS
1000.0000 mg | ORAL_TABLET | Freq: Once | ORAL | Status: AC
  Administered 2015-05-08: 1000 mg via ORAL
  Filled 2015-05-08: qty 4

## 2015-05-08 MED ORDER — DOXYCYCLINE HYCLATE 100 MG PO TABS
100.0000 mg | ORAL_TABLET | Freq: Once | ORAL | Status: AC
  Administered 2015-05-08: 100 mg via ORAL
  Filled 2015-05-08: qty 1

## 2015-05-08 MED ORDER — LIDOCAINE HCL (PF) 1 % IJ SOLN
5.0000 mL | Freq: Once | INTRAMUSCULAR | Status: AC
  Administered 2015-05-08: 5 mL via INTRADERMAL
  Filled 2015-05-08: qty 5

## 2015-05-08 MED ORDER — METRONIDAZOLE 500 MG PO TABS
2000.0000 mg | ORAL_TABLET | Freq: Once | ORAL | Status: AC
  Administered 2015-05-08: 2000 mg via ORAL
  Filled 2015-05-08: qty 4

## 2015-05-08 MED ORDER — HYDROCODONE-ACETAMINOPHEN 5-325 MG PO TABS
2.0000 | ORAL_TABLET | Freq: Once | ORAL | Status: AC
  Administered 2015-05-08: 2 via ORAL
  Filled 2015-05-08: qty 2

## 2015-05-08 MED ORDER — CEFTRIAXONE SODIUM 250 MG IJ SOLR
250.0000 mg | Freq: Once | INTRAMUSCULAR | Status: AC
  Administered 2015-05-08: 250 mg via INTRAMUSCULAR
  Filled 2015-05-08: qty 250

## 2015-05-08 NOTE — ED Provider Notes (Signed)
CSN: [id number]     Arrival date & time 05/08/15  2056 History   First MD Initiated Contact with Patient 05/08/15 2131     Chief Complaint  Patient presents with  . Abdominal Pain  . Vaginal Discharge     (Consider location/radiation/quality/duration/timing/severity/associated sxs/prior Treatment) The history is provided by the patient and medical records. No language interpreter was used.     Katherine Andrews is a 25 y.o. female  with a hx of UTI, BV, GERD, Gonorrhea and Chlamydia presents to the Emergency Department complaining of gradual, persistent, progressively worsening lower abd pain with associated nausea and vaginal discharge onset 4 weeks ago. Associated symptoms include vaginal bleeding.  No aggravating or alleviating factors.  Pt denies fever, chills, headache, neck pain, chest pain, SOB, diarrhea, weakness.  Pt is sexually active with 1 female partner.  No hx of pregnancy.  LMP: late feb - irregular menses   Past Medical History  Diagnosis Date  . UTI (lower urinary tract infection)   . BV (bacterial vaginosis)   . GERD (gastroesophageal reflux disease)   . Gonorrhea   . Chlamydia    Past Surgical History  Procedure Laterality Date  . No past surgeries     No family history on file. Social History  Substance Use Topics  . Smoking status: Current Every Day Smoker    Types: Cigars  . Smokeless tobacco: None  . Alcohol Use: No   OB History    Gravida Para Term Preterm AB TAB SAB Ectopic Multiple Living   0              Review of Systems  Constitutional: Negative for fever, diaphoresis, appetite change, fatigue and unexpected weight change.  HENT: Negative for mouth sores.   Eyes: Negative for visual disturbance.  Respiratory: Negative for cough, chest tightness, shortness of breath and wheezing.   Cardiovascular: Negative for chest pain.  Gastrointestinal: Negative for nausea, vomiting, abdominal pain, diarrhea and constipation.  Endocrine: Negative for  polydipsia, polyphagia and polyuria.  Genitourinary: Positive for dysuria, vaginal bleeding, vaginal discharge and pelvic pain. Negative for urgency, frequency and hematuria.  Musculoskeletal: Negative for back pain and neck stiffness.  Skin: Negative for rash.  Allergic/Immunologic: Negative for immunocompromised state.  Neurological: Negative for syncope, light-headedness and headaches.  Hematological: Does not bruise/bleed easily.  Psychiatric/Behavioral: Negative for sleep disturbance. The patient is not nervous/anxious.       Allergies  Pineapple  Home Medications   Prior to Admission medications   Medication Sig Start Date End Date Taking? Authorizing Provider  ibuprofen (ADVIL,MOTRIN) 200 MG tablet Take 200 mg by mouth every 6 (six) hours as needed for moderate pain.   Yes Historical Provider, MD  doxycycline (VIBRAMYCIN) 100 MG capsule Take 1 capsule (100 mg total) by mouth 2 (two) times daily. 05/09/15   Pamelia Botto, PA-C  naproxen (NAPROSYN) 500 MG tablet Take 1 tablet (500 mg total) by mouth 2 (two) times daily with a meal. 05/09/15   Karma Ansley, PA-C  ondansetron (ZOFRAN ODT) 4 MG disintegrating tablet  ODT q4 hours prn nausea/vomit 05/09/15   Marki Frede, PA-C   BP 114/79 mmHg  Pulse 75  Temp(Src) 98.2 F (36.8 C) (Oral)  Resp 16  Ht  (1.6 m)  Wt 87.998 kg  BMI 34.37 kg/m2  SpO2 99%  LMP 03/29/2015 (Exact Date) Physical Exam  Constitutional: She appears well-developed and well-nourished. No distress.  Awake, alert, nontoxic appearance  HENT:  Head: Normocephalic and atraumatic.  Mouth/Throat:  Oropharynx is clear and moist. No oropharyngeal exudate.  Eyes: Conjunctivae are normal. No scleral icterus.  Neck: Normal range of motion. Neck supple.  Cardiovascular: Normal rate, regular rhythm, normal heart sounds and intact distal pulses.   No murmur heard. Pulmonary/Chest: Effort normal and breath sounds normal. No respiratory  distress. She has no wheezes.  Equal chest expansion  Abdominal: Soft. Bowel sounds are normal. She exhibits no mass. There is no tenderness. There is no rebound and no guarding. Hernia confirmed negative in the right inguinal area and confirmed negative in the left inguinal area.  Genitourinary: Uterus normal. No labial fusion. There is no rash, tenderness or lesion on the right labia. There is no rash, tenderness or lesion on the left labia. Uterus is not deviated, not enlarged, not fixed and not tender. Cervix exhibits motion tenderness and friability. Cervix exhibits no discharge. Right adnexum displays tenderness. Right adnexum displays no mass and no fullness. Left adnexum displays no mass, no tenderness and no fullness. No erythema, tenderness or bleeding in the vagina. No foreign body around the vagina. No signs of injury around the vagina. Vaginal discharge (yellow/green, thick, moderate) found.  Musculoskeletal: Normal range of motion. She exhibits no edema.  Lymphadenopathy:       Right: No inguinal adenopathy present.       Left: No inguinal adenopathy present.  Neurological: She is alert.  Speech is clear and goal oriented Moves extremities without ataxia  Skin: Skin is warm and dry. She is not diaphoretic. No erythema.  Psychiatric: She has a normal mood and affect.  Nursing note and vitals reviewed.   ED Course  Procedures (including critical care time) Labs Review Labs Reviewed  WET PREP, GENITAL - Abnormal; Notable for the following:    Yeast Wet Prep HPF POC PRESENT (*)    WBC, Wet Prep HPF POC MANY (*)    All other components within normal limits  COMPREHENSIVE METABOLIC PANEL - Abnormal; Notable for the following:    Glucose, Bld 109 (*)    All other components within normal limits  URINALYSIS, ROUTINE W REFLEX MICROSCOPIC (NOT AT [facility]) - Abnormal; Notable for the following:    Hgb urine dipstick LARGE (*)    Leukocytes, UA SMALL (*)    All other components within  normal limits  URINE MICROSCOPIC-ADD ON - Abnormal; Notable for the following:    Squamous Epithelial / LPF 0-5 (*)    Bacteria, UA RARE (*)    All other components within normal limits  LIPASE, BLOOD  CBC  POC URINE PREG, ED  GC/CHLAMYDIA PROBE AMP ([place]) NOT AT [facility]    Imaging Review [place] Transvaginal Non-ob  05/09/2015  CLINICAL DATA:  Pelvic pain. EXAM: TRANSABDOMINAL AND TRANSVAGINAL ULTRASOUND OF PELVIS DOPPLER ULTRASOUND OF OVARIES TECHNIQUE: Both transabdominal and transvaginal ultrasound examinations of the pelvis were performed. Transabdominal technique was performed for global imaging of the pelvis including uterus, ovaries, adnexal regions, and pelvic cul-de-sac. It was necessary to proceed with endovaginal exam following the transabdominal exam to visualize the uterus and ovaries. Color and duplex Doppler ultrasound was utilized to evaluate blood flow to the ovaries. COMPARISON:  None. FINDINGS: Uterus Measurements: 7.1 x 3.1 x 3.7 cm. Uterus is anteverted. No fibroids or other mass visualized. Nabothian cysts in the cervix. Endometrium Thickness: 3.9 mm.  No focal abnormality visualized. Right ovary Measurements: 3.9 x 2.4 x 3 cm. Normal appearance/no adnexal mass. Left ovary Measurements: 3.8 x 2.9 x 2.3 cm. Normal appearance/no adnexal mass. Pulsed  Doppler evaluation of both ovaries demonstrates normal low-resistance arterial and venous waveforms. Flow is demonstrated within both ovaries on color flow Doppler imaging. Other findings No abnormal free fluid. IMPRESSION: Normal ultrasound appearance of the uterus and ovaries. No evidence of ovarian mass or torsion. Electronically Signed   By: Burman Nieves M.D.   On: 05/09/2015 01:42   US Pelvis Complete  05/09/2015  CLINICAL DATA:  Pelvic pain. EXAM: TRANSABDOMINAL AND TRANSVAGINAL ULTRASOUND OF PELVIS DOPPLER ULTRASOUND OF OVARIES TECHNIQUE: Both transabdominal and transvaginal ultrasound examinations of the pelvis were  performed. Transabdominal technique was performed for global imaging of the pelvis including uterus, ovaries, adnexal regions, and pelvic cul-de-sac. It was necessary to proceed with endovaginal exam following the transabdominal exam to visualize the uterus and ovaries. Color and duplex Doppler ultrasound was utilized to evaluate blood flow to the ovaries. COMPARISON:  None. FINDINGS: Uterus Measurements: 7.1 x 3.1 x 3.7 cm. Uterus is anteverted. No fibroids or other mass visualized. Nabothian cysts in the cervix. Endometrium Thickness: 3.9 mm.  No focal abnormality visualized. Right ovary Measurements: 3.9 x 2.4 x 3 cm. Normal appearance/no adnexal mass. Left ovary Measurements: 3.8 x 2.9 x 2.3 cm. Normal appearance/no adnexal mass. Pulsed Doppler evaluation of both ovaries demonstrates normal low-resistance arterial and venous waveforms. Flow is demonstrated within both ovaries on color flow Doppler imaging. Other findings No abnormal free fluid. IMPRESSION: Normal ultrasound appearance of the uterus and ovaries. No evidence of ovarian mass or torsion. Electronically Signed   By: Burman Nieves M.D.   On: 05/09/2015 01:42   [place] Art/ven Flow Abd Pelv Doppler  05/09/2015  : CLINICAL DATA:  Pelvic pain. EXAM:TRANSABDOMINAL AND TRANSVAGINAL ULTRASOUND OF PELVISDOPPLER ULTRASOUND OF OVARIES TECHNIQUE:Both transabdominal and transvaginal ultrasound examinations of thepelvis were performed. Transabdominal technique was performed forglobal imaging of the pelvis including uterus, ovaries, adnexalregions, and pelvic cul-de-sac.It was necessary to proceed with endovaginal exam following thetransabdominal exam to visualize the uterus and ovaries. Color andduplex Doppler ultrasound was utilized to evaluate blood flow to theOvaries. COMPARISON:  None. FINDINGS:UterusMeasurements: 7.1 x 3.1 x 3.7 cm. Uterus is anteverted. No fibroidsor other mass visualized. Nabothian cysts in the cervix. EndometriumThickness: 3.9 mm.  No  focal abnormality visualized. Right ovaryMeasurements: 3.9 x 2.4 x 3 cm. Normal appearance/no adnexal mass. Left ovaryMeasurements: 3.8 x 2.9 x 2.3 cm. Normal appearance/no adnexal mass. Pulsed Doppler evaluation of both ovaries demonstrates normallow-resistance arterial and venous waveforms. Flow is demonstratedwithin both ovaries on color flow Doppler imaging. Other findingsNo abnormal free fluid. IMPRESSION:Normal ultrasound appearance of the uterus and ovaries. No evidenceof ovarian mass or torsion. Electronically Signed   By: Burman Nieves M.D.   On: 05/09/2015 02:05   I have personally reviewed and evaluated these images and lab results as part of my medical decision-making.    MDM   Final diagnoses:  Pelvic pain in female  PID (acute pelvic inflammatory disease)  Vaginal discharge   [facility] presents with lower abdominal pain, vaginal discharge and nausea times approximately 4 weeks. Labs are reassuring.  Physical exam is consistent with PID. Patient has cervical motion tenderness and right adnexal tenderness. Ultrasound pending to rule out tubo-ovarian abscess. Patient treated here in the emergency department with doxycycline, Flagyl, azithromycin and Rocephin.  2:23 AM Reviewed ultrasound results which show no evidence of torsion, mass or tubo-ovarian abscess. Patient continues to appear well. She has tolerated by mouth in the emergency department. Discharged home with doxycycline, Zofran and naproxen. Patient and partner state understanding and are in  agreement with the plan. She is to follow-up with [facility]. Emergent return discussed and documented on discharge instructions.   Dahlia Client Ladona Rosten, PA-C 05/09/15 1610  Alvira Monday, MD 05/10/15 1800

## 2015-05-08 NOTE — ED Notes (Signed)
Pt. reports low abdominal pain with nausea and vaginal discharge onset 4 weeks ago , denies emesis or diarrhea / no fever or chills.

## 2015-05-09 LAB — GC/CHLAMYDIA PROBE AMP (~~LOC~~) NOT AT ARMC
CHLAMYDIA, DNA PROBE: NEGATIVE
Neisseria Gonorrhea: NEGATIVE

## 2015-05-09 MED ORDER — NAPROXEN 500 MG PO TABS: 500.0000 mg | ORAL_TABLET | Freq: Two times a day (BID) | ORAL | Status: DC

## 2015-05-09 MED ORDER — DOXYCYCLINE HYCLATE 100 MG PO CAPS: 100.0000 mg | ORAL_CAPSULE | Freq: Two times a day (BID) | ORAL | Status: DC

## 2015-05-09 MED ORDER — ONDANSETRON 4 MG PO TBDP: ORAL_TABLET | ORAL | Status: DC

## 2015-05-09 MED ORDER — FLUCONAZOLE 100 MG PO TABS
150.0000 mg | ORAL_TABLET | Freq: Once | ORAL | Status: AC
  Administered 2015-05-09: 150 mg via ORAL
  Filled 2015-05-09: qty 2

## 2015-05-09 NOTE — Discharge Instructions (Signed)
1. Medications: doxycycline, zofran as needed for vomiting, naprosyn for pain, usual home medications 2. Treatment: rest, drink plenty of fluids, complete the entire course of your antibiotic 3. Follow Up: Please followup with your primary doctor and OB/GYN in 3-5 days for discussion of your diagnoses and further evaluation after today's visit; if you do not have a primary care doctor use the resource guide provided to find one; Please return to the ER for worsening symptoms, fevers, persistent vomiting   Pelvic Inflammatory Disease Pelvic inflammatory disease (PID) refers to an infection in some or all of the female organs. The infection can be in the uterus, ovaries, fallopian tubes, or the surrounding tissues in the pelvis. PID can cause abdominal or pelvic pain that comes on suddenly (acute pelvic pain). PID is a serious infection because it can lead to lasting (chronic) pelvic pain or the inability to have children (infertility). CAUSES This condition is most often caused by an infection that is spread during sexual contact. However, the infection can also be caused by the normal bacteria that are found in the vaginal tissues if these bacteria travel upward into the reproductive organs. PID can also occur following:  The birth of a baby.  A miscarriage.  An abortion.  Major pelvic surgery.  The use of an intrauterine device (IUD).  A sexual assault. RISK FACTORS This condition is more likely to develop in women who:  Are younger than 23 years of age.  [facility].  Use nonbarrier contraception.  Have multiple sexual partners.  Have sex with someone who has symptoms of an STD (sexually transmitted disease).  Use oral contraception. At times, certain behaviors can also increase the possibility of getting PID, such as:  Using a vaginal douche.  Having an IUD in place. SYMPTOMS Symptoms of this condition include:  Abdominal or pelvic  pain.  Fever.  Chills.  Abnormal vaginal discharge.  Abnormal uterine bleeding.  Unusual pain shortly after the end of a menstrual period.  Painful urination.  Pain with sexual intercourse.  Nausea and vomiting. DIAGNOSIS To diagnose this condition, your health care provider will do a physical exam and take your medical history. A pelvic exam typically reveals great tenderness in the uterus and the surrounding pelvic tissues. You may also have tests, such as:  Lab tests, including a pregnancy test, blood tests, and urine test.  Culture tests of the vagina and cervix to check for an STD.  Ultrasound.  A laparoscopic procedure to look inside the pelvis.  Examining vaginal secretions under a microscope. TREATMENT Treatment for this condition may involve one or more approaches.  Antibiotic medicines may be prescribed to be taken by mouth.  Sexual partners may need to be treated if the infection is caused by an STD.  For more severe cases, hospitalization may be needed to give antibiotics directly into a vein through an IV tube.  Surgery may be needed if other treatments do not help, but this is rare. It may take weeks until you are completely well. If you are diagnosed with PID, you should also be checked for human immunodeficiency virus (HIV). Your health care provider may test you for infection again 3 months after treatment. You should not have unprotected sex. HOME CARE INSTRUCTIONS  Take over-the-counter and prescription medicines only as told by your health care provider.  If you were prescribed an antibiotic medicine, take it as told by your health care provider. Do not stop taking the antibiotic even if  you start to feel better.  Do not have sexual intercourse until treatment is completed or as told by your health care provider. If PID is confirmed, your recent sexual partners will need treatment, especially if you had unprotected sex.  Keep all follow-up visits as  told by your health care provider. This is important. SEEK MEDICAL CARE IF:  You have increased or abnormal vaginal discharge.  Your pain does not improve.  You vomit.  You have a fever.  You cannot tolerate your medicines.  Your partner has an STD.  You have pain when you urinate. SEEK IMMEDIATE MEDICAL CARE IF:  You have increased abdominal or pelvic pain.  You have chills.  Your symptoms are not better in 72 hours even with treatment.   This information is not intended to replace advice given to you by your health care provider. Make sure you discuss any questions you have with your health care provider.   Document Released: 01/08/2005 Document Revised: 09/29/2014 Document Reviewed: 02/15/2014 Elsevier Interactive Patient Education [employer]

## 2015-05-09 NOTE — ED Notes (Signed)
Patient transported to Ultrasound 

## 2015-05-09 NOTE — ED Notes (Signed)
Pt. Verbalizes understanding of d/c instructions and displays no s/s of distress at this time. VS stable. Pt. Ambulatory out of the unit with steady gait.

## 2015-05-09 NOTE — ED Notes (Signed)
Patient's partner came up to nurses station and complained about how long they have waited and asked how much longer it would be (whilst throwing in a curse word here and there). Explained to patient that we just got the results in and the PA would be in to discuss them shortly. PA made aware.

## 2015-05-16 ENCOUNTER — Emergency Department (HOSPITAL_COMMUNITY)
Admission: EM | Admit: 2015-05-16 | Discharge: 2015-05-16 | Disposition: A | Discharge status: Home / Self Care | Payer: Self-pay | Attending: Emergency Medicine | Admitting: Emergency Medicine

## 2015-05-16 ENCOUNTER — Emergency Department (HOSPITAL_COMMUNITY): Payer: Self-pay

## 2015-05-16 ENCOUNTER — Encounter (HOSPITAL_COMMUNITY): Payer: Self-pay | Admitting: Emergency Medicine

## 2015-05-16 DIAGNOSIS — G8929 Other chronic pain: Secondary | ICD-10-CM | POA: Insufficient documentation

## 2015-05-16 DIAGNOSIS — R079 Chest pain, unspecified: Secondary | ICD-10-CM | POA: Insufficient documentation

## 2015-05-16 DIAGNOSIS — F17210 Nicotine dependence, cigarettes, uncomplicated: Secondary | ICD-10-CM | POA: Insufficient documentation

## 2015-05-16 LAB — CBC
HEMATOCRIT: 39.1 % (ref 36.0–46.0)
Hemoglobin: 13 g/dL (ref 12.0–15.0)
MCH: 28.6 pg (ref 26.0–34.0)
MCHC: 33.2 g/dL (ref 30.0–36.0)
MCV: 85.9 fL (ref 78.0–100.0)
Platelets: 290 10*3/uL (ref 150–400)
RBC: 4.55 MIL/uL (ref 3.87–5.11)
RDW: 13.6 % (ref 11.5–15.5)
WBC: 7.3 10*3/uL (ref 4.0–10.5)

## 2015-05-16 LAB — BASIC METABOLIC PANEL
ANION GAP: 10 (ref 5–15)
BUN: 16 mg/dL (ref 6–20)
CALCIUM: 9.7 mg/dL (ref 8.9–10.3)
CO2: 24 mmol/L (ref 22–32)
Chloride: 106 mmol/L (ref 101–111)
Creatinine, Ser: 0.69 mg/dL (ref 0.44–1.00)
GFR calc Af Amer: >60 mL/min (ref >60)
GLUCOSE: 87 mg/dL (ref 65–99)
POTASSIUM: 4.2 mmol/L (ref 3.5–5.1)
SODIUM: 140 mmol/L (ref 135–145)

## 2015-05-16 LAB — I-STAT TROPONIN, ED: TROPONIN I, POC: 0 ng/mL (ref 0.00–0.08)

## 2015-05-16 NOTE — ED Notes (Signed)
Pt sts she is leaving and will return in the AM.  Encouraged pt to stay, sts is unwilling.  Is A/O X4, ambulatory and in NAD

## 2015-05-16 NOTE — ED Notes (Signed)
Pt reports hx of chronic CP which has flared up again. Pt alert x4. NAD at this time.

## 2015-05-17 ENCOUNTER — Emergency Department (HOSPITAL_COMMUNITY)
Admission: EM | Admit: 2015-05-17 | Discharge: 2015-05-17 | Disposition: A | Discharge status: Home / Self Care | Payer: No Typology Code available for payment source | Attending: Emergency Medicine | Admitting: Emergency Medicine

## 2015-05-17 ENCOUNTER — Encounter (HOSPITAL_COMMUNITY): Payer: Self-pay | Admitting: Emergency Medicine

## 2015-05-17 DIAGNOSIS — N926 Irregular menstruation, unspecified: Secondary | ICD-10-CM | POA: Insufficient documentation

## 2015-05-17 DIAGNOSIS — Z87440 Personal history of urinary (tract) infections: Secondary | ICD-10-CM | POA: Insufficient documentation

## 2015-05-17 DIAGNOSIS — R102 Pelvic and perineal pain: Secondary | ICD-10-CM | POA: Insufficient documentation

## 2015-05-17 DIAGNOSIS — R300 Dysuria: Secondary | ICD-10-CM | POA: Insufficient documentation

## 2015-05-17 DIAGNOSIS — N939 Abnormal uterine and vaginal bleeding, unspecified: Secondary | ICD-10-CM | POA: Insufficient documentation

## 2015-05-17 DIAGNOSIS — F17210 Nicotine dependence, cigarettes, uncomplicated: Secondary | ICD-10-CM | POA: Insufficient documentation

## 2015-05-17 DIAGNOSIS — Z791 Long term (current) use of non-steroidal anti-inflammatories (NSAID): Secondary | ICD-10-CM | POA: Insufficient documentation

## 2015-05-17 DIAGNOSIS — Z8619 Personal history of other infectious and parasitic diseases: Secondary | ICD-10-CM | POA: Insufficient documentation

## 2015-05-17 DIAGNOSIS — R350 Frequency of micturition: Secondary | ICD-10-CM | POA: Insufficient documentation

## 2015-05-17 DIAGNOSIS — N898 Other specified noninflammatory disorders of vagina: Secondary | ICD-10-CM | POA: Insufficient documentation

## 2015-05-17 DIAGNOSIS — N941 Unspecified dyspareunia: Secondary | ICD-10-CM | POA: Insufficient documentation

## 2015-05-17 DIAGNOSIS — Z3202 Encounter for pregnancy test, result negative: Secondary | ICD-10-CM | POA: Insufficient documentation

## 2015-05-17 DIAGNOSIS — Z792 Long term (current) use of antibiotics: Secondary | ICD-10-CM | POA: Insufficient documentation

## 2015-05-17 DIAGNOSIS — Z8719 Personal history of other diseases of the digestive system: Secondary | ICD-10-CM | POA: Insufficient documentation

## 2015-05-17 LAB — URINALYSIS, ROUTINE W REFLEX MICROSCOPIC
Bilirubin Urine: NEGATIVE
Glucose, UA: NEGATIVE mg/dL
Hgb urine dipstick: NEGATIVE
Ketones, ur: NEGATIVE mg/dL
LEUKOCYTES UA: NEGATIVE
NITRITE: NEGATIVE
PH: 6 (ref 5.0–8.0)
Protein, ur: NEGATIVE mg/dL
SPECIFIC GRAVITY, URINE: 1.028 (ref 1.005–1.030)

## 2015-05-17 LAB — WET PREP, GENITAL
Clue Cells Wet Prep HPF POC: NONE SEEN
Sperm: NONE SEEN
Trich, Wet Prep: NONE SEEN
Yeast Wet Prep HPF POC: NONE SEEN

## 2015-05-17 LAB — PREGNANCY, URINE: Preg Test, Ur: NEGATIVE

## 2015-05-17 NOTE — ED Notes (Signed)
Pt reports being treated here for PID 2 weeks ago. Pt states naproxen not helping with her pain. States still has vaginal discharge. VSS.

## 2015-05-17 NOTE — ED Provider Notes (Signed)
CSN: [id number]     Arrival date & time 05/17/15  1101 History   First MD Initiated Contact with Patient 05/17/15 1415     Chief Complaint  Patient presents with  . Vaginal Discharge     (Consider location/radiation/quality/duration/timing/severity/associated sxs/prior Treatment) HPI 25 y.o. 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 She denies any exacerbating or alleviating factors to these pains stating that they seem to come on randomly lasting for a few seconds and then abating spontaneously. She is sexually active with one female partner and notes some dyspareunia. She denies any genital injuries during sex using toys. She is a G1 P0 last pregnancy was in 2015 which ended in miscarriage. Denies any chance that she could be pregnant currently. She denies any history of previous STI.  He states she's been taking naproxen intermittently for her transient abdominal cramping to no avail. He states that last night her menses started. LMP was around March 20. Patient notes irregular periods. She has not followed up with a OBGYN.   Past Medical History  Diagnosis Date  . UTI (lower urinary tract infection)   . BV (bacterial vaginosis)   . GERD (gastroesophageal reflux disease)   . Gonorrhea   . Chlamydia    Past Surgical History  Procedure Laterality Date  . No past surgeries     History reviewed. No pertinent family history. Social History  Substance Use Topics  . Smoking status: Current Every Day Smoker    Types: Cigars  . Smokeless tobacco: None  . Alcohol Use: No   OB History    Gravida Para Term Preterm AB TAB SAB Ectopic Multiple Living   0               Review of Systems  Constitutional: Negative for fever, chills, activity change and appetite change.  HENT: Negative for congestion and rhinorrhea.   Respiratory: Negative for cough and shortness of breath.   Cardiovascular: Negative for chest pain.  Gastrointestinal: Negative for nausea, vomiting, abdominal pain, diarrhea and blood in stool.  Genitourinary: Positive for dysuria, frequency, vaginal bleeding (started last night), vaginal discharge, vaginal pain, menstrual problem (irreg periods), pelvic pain and dyspareunia. Negative for urgency, hematuria, flank pain, decreased urine volume and genital sores.  Musculoskeletal: Negative for back pain and neck pain.  Skin: Negative for rash and wound.  Neurological: Negative for dizziness, syncope and headaches.  All other systems reviewed and are negative.     Allergies  Pineapple  Home Medications   Prior to Admission medications   Medication Sig Start Date End Date Taking? Authorizing Provider  doxycycline (VIBRAMYCIN) 100 MG capsule Take 1 capsule (100 mg total) by mouth 2 (two) times daily. 05/09/15  Yes Hannah Muthersbaugh, PA-C  naproxen (NAPROSYN) 500 MG tablet Take 1 tablet (500 mg total) by mouth 2 (two) times daily with a meal. 05/09/15  Yes Hannah Muthersbaugh, PA-C  ondansetron (ZOFRAN ODT) 4 MG disintegrating tablet 4mg  ODT q4 hours prn nausea/vomit 05/09/15   Hannah Muthersbaugh, PA-C   BP 101/63 mmHg  Pulse 50  Temp(Src) 98.1 F (36.7 C) (Oral)  Resp 16  SpO2 100%  LMP 04/11/2015 Physical Exam  Constitutional: She is  oriented to person, place, and time. She appears well-developed and well-nourished. No distress.  HENT:  Head: Normocephalic and atraumatic.  Nose: Nose normal.  Mouth/Throat: Oropharynx is clear and moist.  Eyes: Conjunctivae and EOM are normal. Pupils are equal, round, and reactive to light.  Cardiovascular: Normal rate, regular rhythm, normal heart sounds and intact distal pulses.    Pulmonary/Chest: Effort normal and breath sounds normal.  Abdominal: Soft. She exhibits no distension. There is no tenderness. There is no rebound and no guarding.  Genitourinary: Uterus normal. Uterus is not tender. Cervix exhibits discharge. Cervix exhibits no motion tenderness and no friability. Right adnexum displays no mass and no tenderness. Left adnexum displays tenderness (mild left adnexal tenderness). Left adnexum displays no mass. No tenderness or bleeding in the vagina. No foreign body around the vagina. No signs of injury around the vagina. Vaginal discharge (mild-moderate white thin discharge) found.  Musculoskeletal: She exhibits no edema or tenderness.  Neurological: She is alert and oriented to person, place, and time. No cranial nerve deficit. Coordination normal.  Skin: Skin is warm and dry. No rash noted. She is not diaphoretic.  Nursing note and vitals reviewed.   ED Course  Procedures (including critical care time) Labs Review Labs Reviewed  WET PREP, GENITAL - Abnormal; Notable for the following:    WBC, Wet Prep HPF POC MANY (*)    All other components within normal limits  PREGNANCY, URINE  URINALYSIS, ROUTINE W REFLEX MICROSCOPIC (NOT AT [facility])  GC/CHLAMYDIA PROBE AMP ([place]) NOT AT [facility]    Imaging Review Dg Chest 2 View  05/16/2015  CLINICAL DATA:  Mid chest pain for the past 3 years. History of smoking. EXAM: CHEST  2 VIEW COMPARISON:  07/20/2011; 07/02/2011 FINDINGS: Grossly unchanged cardiac silhouette and mediastinal contours. No focal parenchymal opacities. No pleural effusion or pneumothorax. No evidence of edema. No acute osseous abnormalities. IMPRESSION: No acute cardiopulmonary disease. Electronically Signed   By: Simonne Come M.D.   On: 05/16/2015 18:54   I have personally reviewed and evaluated these images and lab results as part of my medical decision-making.   EKG Interpretation None      MDM  25 y.o. female presents to the ED noting  persistent dysuria, and intermittent pelvic pain, predominantly in the left adnexal region, and vaginal discharge that has been going on for now 6 weeks. She was seen on 4/17 and was diagnosed with PID at that time treated empirically, is been taking her Rx'd doxycycline as prescribed. Physical exam reassuring, as above. Benign abdomen with no guarding rebound or peritoneal signs. Pelvic exam shows mild to moderate white vaginal discharge but no evidence of cervicitis, CMT. The patient has mild left adnexal tenderness, similar to get reportedly improved from that which she had on the 17th. Review of the records shows that at that time the patient had significant CMT and left adnexal tenderness. Ultrasound was done that time and returned showing no evidence of TOA, or torsion or other acute pelvic abnormality. Given significant and more reassuring exam today that her symptoms are improving yet not completely resolved. Low suspicion for appendicitis, TOA, ovarian torsion, diverticulitis, or other intra-abdominal or pelvic emergencies. His recommended to continue to take her Rx'd doxycycline and to follow up as an outpatient with OB/GYN at [facility] clinic. This plan was discussed with the patient and her partner at the bedside and they stated both understanding and agreement with this plan. His recommended to take ibuprofen or naproxen for crampy abdominal  pain associated with menses.  She was then discharged home in good condition.  Final diagnoses:  Vaginal discharge  Pelvic pain in female      Francoise Ceo, DO 05/17/15 1724  Glynn Octave, MD 05/17/15 [phone]

## 2015-05-18 LAB — GC/CHLAMYDIA PROBE AMP (~~LOC~~) NOT AT ARMC
Chlamydia: NEGATIVE
Neisseria Gonorrhea: NEGATIVE

## 2015-06-18 ENCOUNTER — Encounter (HOSPITAL_COMMUNITY): Payer: Self-pay

## 2015-06-18 ENCOUNTER — Emergency Department (HOSPITAL_COMMUNITY)
Admission: EM | Admit: 2015-06-18 | Discharge: 2015-06-18 | Disposition: A | Discharge status: Home / Self Care | Payer: No Typology Code available for payment source | Attending: Emergency Medicine | Admitting: Emergency Medicine

## 2015-06-18 DIAGNOSIS — R1032 Left lower quadrant pain: Secondary | ICD-10-CM | POA: Insufficient documentation

## 2015-06-18 DIAGNOSIS — Z791 Long term (current) use of non-steroidal anti-inflammatories (NSAID): Secondary | ICD-10-CM | POA: Insufficient documentation

## 2015-06-18 DIAGNOSIS — R102 Pelvic and perineal pain unspecified side: Secondary | ICD-10-CM

## 2015-06-18 DIAGNOSIS — N898 Other specified noninflammatory disorders of vagina: Secondary | ICD-10-CM | POA: Insufficient documentation

## 2015-06-18 DIAGNOSIS — N888 Other specified noninflammatory disorders of cervix uteri: Secondary | ICD-10-CM | POA: Insufficient documentation

## 2015-06-18 DIAGNOSIS — Z87440 Personal history of urinary (tract) infections: Secondary | ICD-10-CM | POA: Insufficient documentation

## 2015-06-18 DIAGNOSIS — Z792 Long term (current) use of antibiotics: Secondary | ICD-10-CM | POA: Insufficient documentation

## 2015-06-18 DIAGNOSIS — F17210 Nicotine dependence, cigarettes, uncomplicated: Secondary | ICD-10-CM | POA: Insufficient documentation

## 2015-06-18 DIAGNOSIS — Z8619 Personal history of other infectious and parasitic diseases: Secondary | ICD-10-CM | POA: Insufficient documentation

## 2015-06-18 DIAGNOSIS — Z3202 Encounter for pregnancy test, result negative: Secondary | ICD-10-CM | POA: Insufficient documentation

## 2015-06-18 DIAGNOSIS — Z8719 Personal history of other diseases of the digestive system: Secondary | ICD-10-CM | POA: Insufficient documentation

## 2015-06-18 LAB — URINALYSIS, ROUTINE W REFLEX MICROSCOPIC
Bilirubin Urine: NEGATIVE
Glucose, UA: NEGATIVE mg/dL
HGB URINE DIPSTICK: NEGATIVE
KETONES UR: NEGATIVE mg/dL
Leukocytes, UA: NEGATIVE
NITRITE: NEGATIVE
PH: 8 (ref 5.0–8.0)
Protein, ur: NEGATIVE mg/dL
SPECIFIC GRAVITY, URINE: 1.019 (ref 1.005–1.030)

## 2015-06-18 LAB — WET PREP, GENITAL
Sperm: NONE SEEN
Trich, Wet Prep: NONE SEEN
Yeast Wet Prep HPF POC: NONE SEEN

## 2015-06-18 LAB — POC URINE PREG, ED: PREG TEST UR: NEGATIVE

## 2015-06-18 MED ORDER — METRONIDAZOLE 500 MG PO TABS: 500.0000 mg | ORAL_TABLET | Freq: Two times a day (BID) | ORAL | Status: DC

## 2015-06-18 MED ORDER — CEFTRIAXONE SODIUM 250 MG IJ SOLR
250.0000 mg | Freq: Once | INTRAMUSCULAR | Status: AC
  Administered 2015-06-18: 250 mg via INTRAMUSCULAR
  Filled 2015-06-18: qty 250

## 2015-06-18 MED ORDER — AZITHROMYCIN 250 MG PO TABS
1000.0000 mg | ORAL_TABLET | Freq: Once | ORAL | Status: AC
  Administered 2015-06-18: 1000 mg via ORAL
  Filled 2015-06-18: qty 4

## 2015-06-18 MED ORDER — IBUPROFEN 400 MG PO TABS
600.0000 mg | ORAL_TABLET | Freq: Once | ORAL | Status: AC
  Administered 2015-06-18: 600 mg via ORAL
  Filled 2015-06-18: qty 1

## 2015-06-18 MED ORDER — LIDOCAINE HCL (PF) 1 % IJ SOLN
0.9000 mL | Freq: Once | INTRAMUSCULAR | Status: AC
  Administered 2015-06-18: 0.9 mL
  Filled 2015-06-18: qty 5

## 2015-06-18 NOTE — ED Notes (Signed)
Patient verbalized understanding of discharge instructions and denies any further needs or questions at this time. VS stable. Patient ambulatory with steady gait, pt declined wheelchair.

## 2015-06-18 NOTE — ED Notes (Signed)
Patient here with lower abdominal pain x 3 days, reports discharge with same. No urinary symptoms. History of PID and patient states feels the same

## 2015-06-18 NOTE — Discharge Instructions (Signed)
Pelvic Pain, Female °Female pelvic pain can be caused by many different things and start from a variety of places. Pelvic pain refers to pain that is located in the lower half of the abdomen and between your hips. The pain may occur over a short period of time (acute) or may be reoccurring (chronic). The cause of pelvic pain may be related to disorders affecting the female reproductive organs (gynecologic), but it may also be related to the bladder, kidney stones, an intestinal complication, or muscle or skeletal problems. Getting help right away for pelvic pain is important, especially if there has been severe, sharp, or a sudden onset of unusual pain. It is also important to get help right away because some types of pelvic pain can be life threatening.  °CAUSES  °Below are only some of the causes of pelvic pain. The causes of pelvic pain can be in one of several categories.  °· Gynecologic. °¨ Pelvic inflammatory disease. °¨ Sexually transmitted infection. °¨ Ovarian cyst or a twisted ovarian ligament (ovarian torsion). °¨ Uterine lining that grows outside the uterus (endometriosis). °¨ Fibroids, cysts, or tumors. °¨ Ovulation. °· Pregnancy. °¨ Pregnancy that occurs outside the uterus (ectopic pregnancy). °¨ Miscarriage. °¨ Labor. °¨ Abruption of the placenta or ruptured uterus. °· Infection. °¨ Uterine infection (endometritis). °¨ Bladder infection. °¨ Diverticulitis. °¨ Miscarriage related to a uterine infection (septic abortion). °· Bladder. °¨ Inflammation of the bladder (cystitis). °¨ Kidney stone(s). °· Gastrointestinal. °¨ Constipation. °¨ Diverticulitis. °· Neurologic. °¨ Trauma. °¨ Feeling pelvic pain because of mental or emotional causes (psychosomatic). °· Cancers of the bowel or pelvis. °EVALUATION  °Your caregiver will want to take a careful history of your concerns. This includes recent changes in your health, a careful gynecologic history of your periods (menses), and a sexual history. Obtaining  your family history and medical history is also important. Your caregiver may suggest a pelvic exam. A pelvic exam will help identify the location and severity of the pain. It also helps in the evaluation of which organ system may be involved. In order to identify the cause of the pelvic pain and be properly treated, your caregiver may order tests. These tests may include:  °· A pregnancy test. °· Pelvic ultrasonography. °· An X-ray exam of the abdomen. °· A urinalysis or evaluation of vaginal discharge. °· Blood tests. °HOME CARE INSTRUCTIONS  °· Only take over-the-counter or prescription medicines for pain, discomfort, or fever as directed by your caregiver.   °· Rest as directed by your caregiver.   °· Eat a balanced diet.   °· Drink enough fluids to make your urine clear or pale yellow, or as directed.   °· Avoid sexual intercourse if it causes pain.   °· Apply warm or cold compresses to the lower abdomen depending on which one helps the pain.   °· Avoid stressful situations.   °· Keep a journal of your pelvic pain. Write down when it started, where the pain is located, and if there are things that seem to be associated with the pain, such as food or your menstrual cycle. °· Follow up with your caregiver as directed.   °SEEK MEDICAL CARE IF: °· Your medicine does not help your pain. °· You have abnormal vaginal discharge. °SEEK IMMEDIATE MEDICAL CARE IF:  °· You have heavy bleeding from the vagina.   °· Your pelvic pain increases.   °· You feel light-headed or faint.   °· You have chills.   °· You have pain with urination or blood in your urine.   °· You have uncontrolled   diarrhea or vomiting.   You have a fever or persistent symptoms for more than 3 days.  You have a fever and your symptoms suddenly get worse.   You are being physically or sexually abused.   This information is not intended to replace advice given to you by your health care provider. Make sure you discuss any questions you have with  your health care provider.  Take antibiotics as prescribed. Follow up with OBGYN for re-evaluation. Your Gonorrhea and Chlamydia cultures are still pending. They will result in approximately 72 hours. Return to the ED if you experience severe worsening of your pain, vaginal bleeding, increased vaginal discharge, fevers, chills.

## 2015-06-19 NOTE — ED Provider Notes (Signed)
CSN: [id number]     Arrival date & time 06/18/15  1539 History   First MD Initiated Contact with Patient 06/18/15 1632     Chief Complaint  Patient presents with  . Abdominal Pain     (Consider location/radiation/quality/duration/timing/severity/associated sxs/prior Treatment) HPI   Katherine Andrews is a 25 y.o F with a pmhx of PID presents to the ED today c/o pelvic pain and vaginal discharge x 3 days. Pt states that she has a hx of PID and this feels similar to that time. Pt states that her pain is on the left lower abdomen, "near my ovary". Pt has increased white, malodorous vaginal discharge. She states she is only sexually active with one female partner. She states her last sexual encounter was last night and she had associated pain and light vaginal bleeding during intercourse. She denies dysuria, fevers, chills.   Past Medical History  Diagnosis Date  . UTI (lower urinary tract infection)   . BV (bacterial vaginosis)   . GERD (gastroesophageal reflux disease)   . Gonorrhea   . Chlamydia    Past Surgical History  Procedure Laterality Date  . No past surgeries     No family history on file. Social History  Substance Use Topics  . Smoking status: Current Every Day Smoker    Types: Cigars  . Smokeless tobacco: None  . Alcohol Use: No   OB History    Gravida Para Term Preterm AB TAB SAB Ectopic Multiple Living   0              Review of Systems  All other systems reviewed and are negative.     Allergies  Pineapple  Home Medications   Prior to Admission medications   Medication Sig Start Date End Date Taking? Authorizing Provider  doxycycline (VIBRAMYCIN) 100 MG capsule Take 1 capsule (100 mg total) by mouth 2 (two) times daily. 05/09/15   Hannah Muthersbaugh, PA-C  metroNIDAZOLE (FLAGYL) 500 MG tablet Take 1 tablet (500 mg total) by mouth 2 (two) times daily. 06/18/15   Samantha Tripp Dowless, PA-C  naproxen (NAPROSYN) 500 MG tablet Take 1 tablet (500 mg total)  by mouth 2 (two) times daily with a meal. 05/09/15   Hannah Muthersbaugh, PA-C  ondansetron (ZOFRAN ODT) 4 MG disintegrating tablet  ODT q4 hours prn nausea/vomit 05/09/15   Hannah Muthersbaugh, PA-C   BP 114/64 mmHg  Pulse 61  Temp(Src) 98.4 F (36.9 C) (Oral)  Resp 20  Ht  (1.626 m)  Wt 88.65 kg  BMI 33.53 kg/m2  SpO2 100%  LMP 04/05/2015 Physical Exam  Constitutional: She is oriented to person, place, and time. She appears well-developed and well-nourished. No distress.  HENT:  Head: Normocephalic and atraumatic.  Eyes: Conjunctivae are normal. Right eye exhibits no discharge. Left eye exhibits no discharge. No scleral icterus.  Cardiovascular: Normal rate.   Pulmonary/Chest: Effort normal.  Abdominal: Soft. Bowel sounds are normal. She exhibits no distension and no mass. There is tenderness in the suprapubic area and left lower quadrant. There is no rebound and no guarding.    Genitourinary: There is no lesion on the right labia. There is no lesion on the left labia. Cervix exhibits discharge. Cervix exhibits no motion tenderness and no friability. Left adnexum displays tenderness. Left adnexum displays no mass and no fullness. Vaginal discharge found.    Neurological: She is alert and oriented to person, place, and time. Coordination normal.  Skin: Skin is warm and dry. No rash noted.  She is not diaphoretic. No erythema. No pallor.  Psychiatric: She has a normal mood and affect. Her behavior is normal.  Nursing note and vitals reviewed.   ED Course  Procedures (including critical care time) Labs Review Labs Reviewed  WET PREP, GENITAL - Abnormal; Notable for the following:    Clue Cells Wet Prep HPF POC PRESENT (*)    WBC, Wet Prep HPF POC MANY (*)    All other components within normal limits  URINALYSIS, ROUTINE W REFLEX MICROSCOPIC (NOT AT [facility])  POC URINE PREG, ED  GC/CHLAMYDIA PROBE AMP ([place]) NOT AT [facility]    Imaging Review No results found. I have  personally reviewed and evaluated these images and lab results as part of my medical decision-making.   EKG Interpretation None      MDM   Final diagnoses:  Pelvic pain in female    [age] old female with history of PID presents the ED today complaining of pelvic pain and increased vaginal discharge. Patient last seen for some more symptoms 1 month ago and was prophylactically treated for PID. Her GC and Chlamydia results of the time were negative. She also had an ultrasound that was negative for TOA. Today, on exam she does have increase white, malodorous vaginal discharge. There is left adnexal tenderness. Will prophylactically treat with azithromycin and Rocephin. There were many different PVCs present on her wet prep as well as clue cells. We'll also treat with Flagyl. Do not feel the need to repeat ultrasound today. Urine pregnancy test is negative. Patient is nontoxic and nonseptic appearing and is in no apparent distress. UA negative for infection. GC and chlamydia cultures are pending. Recommend follow-up with OB/GYN. Patient states that in the past when she has had PID her sexual partner was never treated. Encourage patient to have partner evaluated and treated for STD exposure as well. Discussed treatment plan with patient who is agreeable. Return precautions outlined patient discharge instructions.    Lester KinsmanSamantha Tripp AtenDowless, PA-C 06/19/15 1257  Marily MemosJason Mesner, MD 06/19/15 2128

## 2015-06-21 LAB — GC/CHLAMYDIA PROBE AMP (~~LOC~~) NOT AT ARMC
Chlamydia: NEGATIVE
Neisseria Gonorrhea: NEGATIVE

## 2015-08-01 ENCOUNTER — Emergency Department (HOSPITAL_COMMUNITY)
Admission: EM | Admit: 2015-08-01 | Discharge: 2015-08-01 | Disposition: A | Discharge status: Home / Self Care | Payer: No Typology Code available for payment source | Attending: Emergency Medicine | Admitting: Emergency Medicine

## 2015-08-01 ENCOUNTER — Encounter (HOSPITAL_COMMUNITY): Payer: Self-pay | Admitting: Emergency Medicine

## 2015-08-01 DIAGNOSIS — Z79899 Other long term (current) drug therapy: Secondary | ICD-10-CM | POA: Insufficient documentation

## 2015-08-01 DIAGNOSIS — N390 Urinary tract infection, site not specified: Secondary | ICD-10-CM | POA: Insufficient documentation

## 2015-08-01 DIAGNOSIS — F17210 Nicotine dependence, cigarettes, uncomplicated: Secondary | ICD-10-CM | POA: Insufficient documentation

## 2015-08-01 LAB — CBC WITH DIFFERENTIAL/PLATELET
BASOS ABS: 0 10*3/uL (ref 0.0–0.1)
Basophils Relative: 0 %
Eosinophils Absolute: 0.7 10*3/uL (ref 0.0–0.7)
Eosinophils Relative: 8 %
HEMATOCRIT: 40.9 % (ref 36.0–46.0)
HEMOGLOBIN: 13.2 g/dL (ref 12.0–15.0)
LYMPHS ABS: 3.8 10*3/uL (ref 0.7–4.0)
LYMPHS PCT: 42 %
MCH: 28.3 pg (ref 26.0–34.0)
MCHC: 32.3 g/dL (ref 30.0–36.0)
MCV: 87.8 fL (ref 78.0–100.0)
Monocytes Absolute: 0.8 10*3/uL (ref 0.1–1.0)
Monocytes Relative: 9 %
NEUTROS ABS: 3.8 10*3/uL (ref 1.7–7.7)
NEUTROS PCT: 41 %
Platelets: 292 10*3/uL (ref 150–400)
RBC: 4.66 MIL/uL (ref 3.87–5.11)
RDW: 13.7 % (ref 11.5–15.5)
WBC: 9.1 10*3/uL (ref 4.0–10.5)

## 2015-08-01 LAB — URINALYSIS, ROUTINE W REFLEX MICROSCOPIC
BILIRUBIN URINE: NEGATIVE
Glucose, UA: NEGATIVE mg/dL
Ketones, ur: NEGATIVE mg/dL
NITRITE: NEGATIVE
PH: 6 (ref 5.0–8.0)
Protein, ur: NEGATIVE mg/dL
SPECIFIC GRAVITY, URINE: 1.01 (ref 1.005–1.030)

## 2015-08-01 LAB — COMPREHENSIVE METABOLIC PANEL
ALBUMIN: 3.2 g/dL — AB (ref 3.5–5.0)
ALK PHOS: 49 U/L (ref 38–126)
ALT: 22 U/L (ref 14–54)
ANION GAP: 7 (ref 5–15)
AST: 27 U/L (ref 15–41)
BILIRUBIN TOTAL: 0.4 mg/dL (ref 0.3–1.2)
BUN: 11 mg/dL (ref 6–20)
CALCIUM: 9.1 mg/dL (ref 8.9–10.3)
CO2: 24 mmol/L (ref 22–32)
Chloride: 105 mmol/L (ref 101–111)
Creatinine, Ser: 0.7 mg/dL (ref 0.44–1.00)
GFR calc Af Amer: >60 mL/min (ref >60)
GLUCOSE: 92 mg/dL (ref 65–99)
Potassium: 4.3 mmol/L (ref 3.5–5.1)
Sodium: 136 mmol/L (ref 135–145)
TOTAL PROTEIN: 6.4 g/dL — AB (ref 6.5–8.1)

## 2015-08-01 LAB — LIPASE, BLOOD: LIPASE: 30 U/L (ref 11–51)

## 2015-08-01 LAB — URINE MICROSCOPIC-ADD ON

## 2015-08-01 LAB — POC URINE PREG, ED: PREG TEST UR: NEGATIVE

## 2015-08-01 MED ORDER — CEPHALEXIN 500 MG PO CAPS: 500.0000 mg | ORAL_CAPSULE | Freq: Four times a day (QID) | ORAL | Status: DC

## 2015-08-01 MED ORDER — IBUPROFEN 800 MG PO TABS: 800.0000 mg | ORAL_TABLET | Freq: Three times a day (TID) | ORAL | Status: DC | PRN

## 2015-08-01 MED ORDER — IBUPROFEN 800 MG PO TABS
800.0000 mg | ORAL_TABLET | Freq: Once | ORAL | Status: AC
  Administered 2015-08-01: 800 mg via ORAL
  Filled 2015-08-01: qty 1

## 2015-08-01 MED ORDER — CEPHALEXIN 250 MG PO CAPS
500.0000 mg | ORAL_CAPSULE | Freq: Once | ORAL | Status: AC
  Administered 2015-08-01: 500 mg via ORAL
  Filled 2015-08-01: qty 2

## 2015-08-01 MED ORDER — ONDANSETRON 4 MG PO TBDP
4.0000 mg | ORAL_TABLET | Freq: Once | ORAL | Status: AC
  Administered 2015-08-01: 4 mg via ORAL
  Filled 2015-08-01: qty 1

## 2015-08-01 NOTE — Discharge Instructions (Signed)
Urinary Tract Infection Urinary tract infections (UTIs) can develop anywhere along your urinary tract. Your urinary tract is your body's drainage system for removing wastes and extra water. Your urinary tract includes two kidneys, two ureters, a bladder, and a urethra. Your kidneys are a pair of bean-shaped organs. Each kidney is about the size of your fist. They are located below your ribs, one on each side of your spine. CAUSES Infections are caused by microbes, which are microscopic organisms, including fungi, viruses, and bacteria. These organisms are so small that they can only be seen through a microscope. Bacteria are the microbes that most commonly cause UTIs. SYMPTOMS  Symptoms of UTIs may vary by age and gender of the patient and by the location of the infection. Symptoms in young women typically include a frequent and intense urge to urinate and a painful, burning feeling in the bladder or urethra during urination. Older women and men are more likely to be tired, shaky, and weak and have muscle aches and abdominal pain. A fever may mean the infection is in your kidneys. Other symptoms of a kidney infection include pain in your back or sides below the ribs, nausea, and vomiting. DIAGNOSIS To diagnose a UTI, your caregiver will ask you about your symptoms. Your caregiver will also ask you to provide a urine sample. The urine sample will be tested for bacteria and white blood cells. White blood cells are made by your body to help fight infection. TREATMENT  Typically, UTIs can be treated with medication. Because most UTIs are caused by a bacterial infection, they usually can be treated with the use of antibiotics. The choice of antibiotic and length of treatment depend on your symptoms and the type of bacteria causing your infection. HOME CARE INSTRUCTIONS  If you were prescribed antibiotics, take them exactly as your caregiver instructs you. Finish the medication even if you feel better after  you have only taken some of the medication.  Drink enough water and fluids to keep your urine clear or pale yellow.  Avoid caffeine, tea, and carbonated beverages. They tend to irritate your bladder.  Empty your bladder often. Avoid holding urine for long periods of time.  Empty your bladder before and after sexual intercourse.  After a bowel movement, women should cleanse from front to back. Use each tissue only once. SEEK MEDICAL CARE IF:   You have back pain.  You develop a fever.  Your symptoms do not begin to resolve within 3 days. SEEK IMMEDIATE MEDICAL CARE IF:   You have severe back pain or lower abdominal pain.  You develop chills.  You have nausea or vomiting.  You have continued burning or discomfort with urination. MAKE SURE YOU:   Understand these instructions.  Will watch your condition.  Will get help right away if you are not doing well or get worse.   This information is not intended to replace advice given to you by your health care provider. Make sure you discuss any questions you have with your health care provider.   Document Released: 10/18/2004 Document Revised: 09/29/2014 Document Reviewed: 02/16/2011 Elsevier Interactive Patient Education [employer]2016 Elsevier Inc.   To find a primary care or specialty doctor please call [phone] or [phone] to access " Find a Doctor Service."  You may also go on the [facility] website at [email or web address]  There are also multiple [facility] practices throughout the [place] that are frequently accepting new patients. You may find a clinic that is  close to your home and contact them.  [facility] -  [address] [place] [address] [phone]  [facility] in [place] (also locations in [place] and [place]) -  [address] [place] [address] [phone]  [facility] Department  -  [address] [place] [address] [phone]

## 2015-08-01 NOTE — ED Provider Notes (Signed)
TIME SEEN: 5:40 AM  CHIEF COMPLAINT: Abdominal pain, flank pain, urinary frequency  HPI: Pt is a 25 y.o. female with history of previous urinary tract infections, GERD who presents emergency department with lower abdominal pain that radiated into her flank that started yesterday afternoon. She has had urinary frequency but no dysuria or hematuria. States symptoms got worse at 3 AM. Now pain is completely gone for her abdomen and only diffusely across her lower back. Denies fevers, chills, nausea, vomiting, diarrhea, vaginal bleeding or discharge. No history of abdominal surgery. No sick contacts.  ROS: See HPI Constitutional: no fever  Eyes: no drainage  ENT: no runny nose   Cardiovascular:  no chest pain  Resp: no SOB  GI: no vomiting GU: no dysuria Integumentary: no rash  Allergy: no hives  Musculoskeletal: no leg swelling  Neurological: no slurred speech ROS otherwise negative  PAST MEDICAL HISTORY/PAST SURGICAL HISTORY:  Past Medical History  Diagnosis Date  . UTI (lower urinary tract infection)   . BV (bacterial vaginosis)   . GERD (gastroesophageal reflux disease)   . Gonorrhea   . Chlamydia     MEDICATIONS:  Prior to Admission medications   Medication Sig Start Date End Date Taking? Authorizing Provider  doxycycline (VIBRAMYCIN) 100 MG capsule Take 1 capsule (100 mg total) by mouth 2 (two) times daily. 05/09/15   Hannah Muthersbaugh, PA-C  metroNIDAZOLE (FLAGYL) 500 MG tablet Take 1 tablet (500 mg total) by mouth 2 (two) times daily. 06/18/15   Samantha Tripp Dowless, PA-C  naproxen (NAPROSYN) 500 MG tablet Take 1 tablet (500 mg total) by mouth 2 (two) times daily with a meal. 05/09/15   Hannah Muthersbaugh, PA-C  ondansetron (ZOFRAN ODT) 4 MG disintegrating tablet 4mg  ODT q4 hours prn nausea/vomit 05/09/15   Hannah Muthersbaugh, PA-C    ALLERGIES:  Allergies  Allergen Reactions  . Pineapple Swelling    Tongue swelling    SOCIAL HISTORY:  Social History  Substance  Use Topics  . Smoking status: Current Every Day Smoker    Types: Cigars  . Smokeless tobacco: Not on file  . Alcohol Use: No    FAMILY HISTORY: No family history on file.  EXAM: BP 124/76 mmHg  Pulse 62  Temp(Src) 97.6 F (36.4 C) (Oral)  Ht 5\' 4"  (1.626 m)  Wt 198 lb (89.812 kg)  BMI 33.97 kg/m2  SpO2 100%  LMP 03/22/2015 CONSTITUTIONAL: Alert and oriented and responds appropriately to questions. Well-appearing; well-nourished, Does not appear to be any significant distress HEAD: Normocephalic EYES: Conjunctivae clear, PERRL ENT: normal nose; no rhinorrhea; moist mucous membranes NECK: Supple, no meningismus, no LAD  CARD: RRR; S1 and S2 appreciated; no murmurs, no clicks, no rubs, no gallops RESP: Normal chest excursion without splinting or tachypnea; breath sounds clear and equal bilaterally; no wheezes, no rhonchi, no rales, no hypoxia or respiratory distress, speaking full sentences ABD/GI: Normal bowel sounds; non-distended; soft, non-tender, no rebound, no guarding, no peritoneal signs BACK:  The back appears normal and is non-tender to palpation, there is no CVA tenderness, no midline spinal tenderness or step-off or deformity EXT: Normal ROM in all joints; non-tender to palpation; no edema; normal capillary refill; no cyanosis, no calf tenderness or swelling    SKIN: Normal color for age and race; warm; no rash NEURO: Moves all extremities equally, sensation to light touch intact diffusely, cranial nerves II through XII intact PSYCH: The patient's mood and manner are appropriate. Grooming and personal hygiene are appropriate.  MEDICAL DECISION MAKING:  Patient here with urinary frequency, flank pain. Labs are unremarkable. No leukocytosis. Creatinine, LFTs, lipase normal. Urine shows blood, leukocytes and rare bacteria. Culture is pending. I doubt that she has a kidney stone given she is very well-appearing on exam, is no CVA tenderness. Pain is mostly diffusely throughout  the lower back. I suspect this is a urinary tract infection. We'll discharge with Keflex. She is growing Escherichia coli and a previous urine culture 2015 which was pansensitive. Have given her outpatient PCP follow-up information. Discussed return precautions. I feel she is safe to be discharged home.   At this time, I do not feel there is any life-threatening condition present. I have reviewed and discussed all results (EKG, imaging, lab, urine as appropriate), exam findings with patient. I have reviewed nursing notes and appropriate previous records.  I feel the patient is safe to be discharged home without further emergent workup. Discussed usual and customary return precautions. Patient and family (if present) verbalize understanding and are comfortable with this plan.  Patient will follow-up with their primary care provider. If they do not have a primary care provider, information for follow-up has been provided to them. All questions have been answered.       Layla Maw Tabia Landowski, DO 08/01/15 [phone]

## 2015-08-01 NOTE — ED Notes (Signed)
C/o right flank pain with urinary frequency that started yesterday afternoon.

## 2015-08-04 LAB — URINE CULTURE

## 2015-08-05 ENCOUNTER — Telehealth: Payer: Self-pay | Admitting: *Deleted

## 2015-08-05 NOTE — Telephone Encounter (Signed)
Post ED Visit - Positive Culture Follow-up  Culture report reviewed by antimicrobial stewardship pharmacist:  []  Katherine Andrews, Pharm.D. []  Katherine Andrews, Pharm.D., BCPS []  Katherine Andrews, Pharm.D. [x]  Katherine Andrews, Pharm.D., BCPS []  Katherine Andrews, [place]., BCPS, AAHIVP []  Katherine Andrews, Pharm.D., BCPS, AAHIVP []  Katherine Andrews, Pharm.D. []  Katherine Andrews, [address]  Positive urine culture Treated with Cephalexin, organism sensitive to the same and no further patient follow-up is required at this time.  Virl AxeRobertson, [name] [name] 08/05/2015, 11:22 AM

## 2015-08-05 NOTE — Telephone Encounter (Signed)
Post ED Visit - Positive Culture Follow-up  Culture report reviewed by antimicrobial stewardship pharmacist:  []  Enzo BiNathan Batchelder, Pharm.D. []  Celedonio MiyamotoJeremy Frens, [address]Pharm[address]D., BCPS []  Garvin FilaMike Maccia, Pharm.D. [x]  Georgina PillionElizabeth Martin, Pharm.D., BCPS []  [place]angeMinh Pham, [address]Ph[address]D., BCPS, AAHIVP []  Estella HuskMichelle Turner, Pharm.D., BCPS, AAHIVP []  Tennis Mustassie Stewart, Pharm.D. []  Sherle Poeob Vincent, [address]  Positive urine culture Treated with Cephalexin, organism sensitive to the same and no further patient follow-up is required at this time.  Virl AxeRobertson, Parnika Tweten [facility] 08/05/2015, 11:28 AM

## 2015-12-31 ENCOUNTER — Emergency Department (HOSPITAL_COMMUNITY): Payer: Medicaid Other

## 2015-12-31 ENCOUNTER — Encounter (HOSPITAL_COMMUNITY): Payer: Self-pay

## 2015-12-31 ENCOUNTER — Emergency Department (HOSPITAL_COMMUNITY)
Admission: EM | Admit: 2015-12-31 | Discharge: 2015-12-31 | Disposition: A | Discharge status: Home / Self Care | Payer: Medicaid Other | Attending: Emergency Medicine | Admitting: Emergency Medicine

## 2015-12-31 DIAGNOSIS — F17290 Nicotine dependence, other tobacco product, uncomplicated: Secondary | ICD-10-CM | POA: Insufficient documentation

## 2015-12-31 DIAGNOSIS — Z79899 Other long term (current) drug therapy: Secondary | ICD-10-CM | POA: Insufficient documentation

## 2015-12-31 DIAGNOSIS — F410 Panic disorder [episodic paroxysmal anxiety] without agoraphobia: Secondary | ICD-10-CM | POA: Insufficient documentation

## 2015-12-31 DIAGNOSIS — F419 Anxiety disorder, unspecified: Secondary | ICD-10-CM | POA: Insufficient documentation

## 2015-12-31 LAB — COMPREHENSIVE METABOLIC PANEL
ALT: 15 U/L (ref 14–54)
AST: 22 U/L (ref 15–41)
Albumin: 4.3 g/dL (ref 3.5–5.0)
Alkaline Phosphatase: 61 U/L (ref 38–126)
Anion gap: 10 (ref 5–15)
BILIRUBIN TOTAL: 0.4 mg/dL (ref 0.3–1.2)
BUN: 8 mg/dL (ref 6–20)
CHLORIDE: 106 mmol/L (ref 101–111)
CO2: 21 mmol/L — ABNORMAL LOW (ref 22–32)
CREATININE: 0.74 mg/dL (ref 0.44–1.00)
Calcium: 9.5 mg/dL (ref 8.9–10.3)
Glucose, Bld: 94 mg/dL (ref 65–99)
Potassium: 3.5 mmol/L (ref 3.5–5.1)
Sodium: 137 mmol/L (ref 135–145)
TOTAL PROTEIN: 8 g/dL (ref 6.5–8.1)

## 2015-12-31 LAB — RAPID URINE DRUG SCREEN, HOSP PERFORMED
AMPHETAMINES: NOT DETECTED
Barbiturates: NOT DETECTED
Benzodiazepines: NOT DETECTED
Cocaine: NOT DETECTED
OPIATES: NOT DETECTED
Tetrahydrocannabinol: POSITIVE — AB

## 2015-12-31 LAB — CBC WITH DIFFERENTIAL/PLATELET
BASOS ABS: 0 10*3/uL (ref 0.0–0.1)
BASOS PCT: 0 %
EOS PCT: 3 %
Eosinophils Absolute: 0.3 10*3/uL (ref 0.0–0.7)
HCT: 38.6 % (ref 36.0–46.0)
Hemoglobin: 13.2 g/dL (ref 12.0–15.0)
LYMPHS PCT: 26 %
Lymphs Abs: 2.7 10*3/uL (ref 0.7–4.0)
MCH: 28.9 pg (ref 26.0–34.0)
MCHC: 34.2 g/dL (ref 30.0–36.0)
MCV: 84.5 fL (ref 78.0–100.0)
MONO ABS: 0.7 10*3/uL (ref 0.1–1.0)
Monocytes Relative: 7 %
NEUTROS ABS: 6.8 10*3/uL (ref 1.7–7.7)
Neutrophils Relative %: 64 %
PLATELETS: 324 10*3/uL (ref 150–400)
RBC: 4.57 MIL/uL (ref 3.87–5.11)
RDW: 13.6 % (ref 11.5–15.5)
WBC: 10.5 10*3/uL (ref 4.0–10.5)

## 2015-12-31 LAB — I-STAT BETA HCG BLOOD, ED (MC, WL, AP ONLY)

## 2015-12-31 LAB — I-STAT TROPONIN, ED: Troponin i, poc: 0 ng/mL (ref 0.00–0.08)

## 2015-12-31 LAB — ACETAMINOPHEN LEVEL

## 2015-12-31 LAB — SALICYLATE LEVEL

## 2015-12-31 LAB — ETHANOL

## 2015-12-31 MED ORDER — LORAZEPAM 1 MG PO TABS: 1.0000 mg | ORAL_TABLET | Freq: Three times a day (TID) | ORAL | Status: DC | PRN

## 2015-12-31 MED ORDER — ALUM & MAG HYDROXIDE-SIMETH 200-200-20 MG/5ML PO SUSP: 30.0000 mL | ORAL | Status: DC | PRN

## 2015-12-31 MED ORDER — HYDROXYZINE HCL 25 MG PO TABS
50.0000 mg | ORAL_TABLET | Freq: Once | ORAL | Status: AC
  Administered 2015-12-31: 50 mg via ORAL
  Filled 2015-12-31: qty 2

## 2015-12-31 MED ORDER — HYDROXYZINE HCL 25 MG PO TABS: 25.0000 mg | ORAL_TABLET | Freq: Three times a day (TID) | ORAL | 0 refills | Status: DC | PRN

## 2015-12-31 MED ORDER — ZOLPIDEM TARTRATE 5 MG PO TABS: 5.0000 mg | ORAL_TABLET | Freq: Every evening | ORAL | Status: DC | PRN

## 2015-12-31 MED ORDER — ONDANSETRON HCL 4 MG PO TABS: 4.0000 mg | ORAL_TABLET | Freq: Three times a day (TID) | ORAL | Status: DC | PRN

## 2015-12-31 MED ORDER — IBUPROFEN 400 MG PO TABS: 600.0000 mg | ORAL_TABLET | Freq: Three times a day (TID) | ORAL | Status: DC | PRN

## 2015-12-31 MED ORDER — ACETAMINOPHEN 325 MG PO TABS: 650.0000 mg | ORAL_TABLET | ORAL | Status: DC | PRN

## 2015-12-31 NOTE — ED Notes (Addendum)
TTS at bedside. 

## 2015-12-31 NOTE — Discharge Instructions (Signed)
Read the information below.  Your labs, EKG, and chest x-ray are re-assuring.  [facility] of your anxiety, panic attacks, and depression.  I have prescribed vistaril for anxiety, please take as directed.  Use the prescribed medication as directed.  Please discuss all new medications with your pharmacist.   You may return to the Emergency Department at any time for worsening condition or any new symptoms that concern you. Return to ED if develop worsening chest pain, shortness of breath, suicidal thoughts, homicidal thoughts, or any other new/concerning symptoms.

## 2015-12-31 NOTE — ED Notes (Signed)
ED Provider at bedside. 

## 2015-12-31 NOTE — ED Notes (Signed)
Pt tearful stating "I have thought about killing myself. Sorry I'm crying, I never get to talk about this to anyone. Sometimes I hit myself." When asked if she had thoughts about killing herself recently she became more tearful and would not answer RN's question. Pt smells of marijuana, and when asked by RN stated "I smoked earlier today. PA made aware.
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
## 2015-12-31 NOTE — ED Provider Notes (Signed)
[facility]-EMERGENCY DEPT Provider Note   CSN: [id number] Arrival date & time: 12/31/15  1455     History   Chief Complaint Chief Complaint  Patient presents with  . Anxiety    HPI Katherine Andrews is a 25 y.o. female.  Katherine Andrews is a 25 y.o. female with h/o GERD presents to ED with complaint of anxiety attack. She reports has had frequent panic attacks as of late. She complains of associated CP, SOB, nausea, and lightheadedness onset this time morning with the panic attack. She describes the chest pain as a pressure sensation. Patient endorses being under an increase amount of stress lately, specifically referencing her family. She endorses depression, change in appetite, isolating herself from others. She also endorses suicidal thoughts, but does not describe a plan. She practices self harm activities to include pulling her hair and scratching herself. She states she has tried smoking marijuana in the past to help with her anxiety. She did smoke marijuana PTA with no relief. No recent long distance travel/surgery/hospitalization, h/o blood clot, h/o cancer, hormone therapy, hemoptysis, or leg swelling/pain. She states she has never been treated for her anxiety. No other complaints at this time.       Past Medical History:  Diagnosis Date  . BV (bacterial vaginosis)   . Chlamydia   . GERD (gastroesophageal reflux disease)   . Gonorrhea   . UTI (lower urinary tract infection)     Patient Active Problem List   Diagnosis Date Noted  . UTI (lower urinary tract infection)     Past Surgical History:  Procedure Laterality Date  . NO PAST SURGERIES      OB History    Gravida Para Term Preterm AB Living   0             SAB TAB Ectopic Multiple Live Births                   Home Medications    Prior to Admission medications   Medication Sig Start Date End Date Taking? Authorizing Provider  cephALEXin (KEFLEX) 500 MG capsule Take 1 capsule (500 mg total) by mouth 4  (four) times daily. Patient not taking: Reported on 12/31/2015 08/01/15   Layla Maw Ward, DO  hydrOXYzine (ATARAX/VISTARIL) 25 MG tablet Take 1 tablet (25 mg total) by mouth every 8 (eight) hours as needed. 12/31/15   Lona Kettle, PA-C  ibuprofen (ADVIL,MOTRIN) 800 MG tablet Take 1 tablet (800 mg total) by mouth every 8 (eight) hours as needed for mild pain. Patient not taking: Reported on 12/31/2015 08/01/15   Layla Maw Ward, DO    Family History No family history on file.  Social History Social History  Substance Use Topics  . Smoking status: Current Every Day Smoker    Types: Cigars  . Smokeless tobacco: Not on file  . Alcohol use No     Allergies   Pineapple   Review of Systems Review of Systems  Constitutional: Negative for fever.  HENT: Negative for trouble swallowing.   Eyes: Negative for visual disturbance.  Respiratory: Positive for shortness of breath.   Cardiovascular: Positive for chest pain.  Gastrointestinal: Positive for nausea. Negative for abdominal pain and vomiting.  Genitourinary: Negative for dysuria and hematuria.  Musculoskeletal: Negative for arthralgias and myalgias.  Skin: Positive for wound.  Neurological: Positive for light-headedness. Negative for syncope.  Psychiatric/Behavioral: Positive for self-injury and suicidal ideas. The patient is nervous/anxious.      Physical Exam Updated Vital Signs  BP 138/90 (BP Location: Left Arm)   Pulse 107   Temp 98.3 F (36.8 C) (Oral)   Resp (!) 28   Wt 89.8 kg   LMP 12/01/2015   SpO2 100%   BMI 33.99 kg/m   Physical Exam  Constitutional: She appears well-developed and well-nourished. No distress.  HENT:  Head: Normocephalic and atraumatic.  Mouth/Throat: Oropharynx is clear and moist. No oropharyngeal exudate.  Eyes: Conjunctivae and EOM are normal. Pupils are equal, round, and reactive to light. Right eye exhibits no discharge. Left eye exhibits no discharge. No scleral icterus.  Neck:  Normal range of motion and phonation normal. Neck supple. No neck rigidity. Normal range of motion present.  Cardiovascular: Regular rhythm, normal heart sounds and intact distal pulses.  Tachycardia present.   No murmur heard. Pulmonary/Chest: Effort normal and breath sounds normal. No stridor. Tachypnea noted. No respiratory distress. She has no wheezes. She has no rales.  Abdominal: Soft. Bowel sounds are normal. She exhibits no distension. There is no tenderness. There is no rigidity, no rebound, no guarding and no CVA tenderness.  Musculoskeletal: Normal range of motion.  Lymphadenopathy:    She has no cervical adenopathy.  Neurological: She is alert. She has normal strength. She is not disoriented. No sensory deficit. She exhibits normal muscle tone. Coordination and gait normal. GCS eye subscore is 4. GCS verbal subscore is 5. GCS motor subscore is 6.  CN 2-12 grossly intact. Moves all extremities with ease. Strength and sensation symmetric and equal.   Skin: Skin is warm and dry. She is not diaphoretic.  Psychiatric: Her mood appears anxious. She exhibits a depressed mood. She expresses suicidal ideation.  Patient is tearful and anxious on exam.      ED Treatments / Results  Labs (all labs ordered are listed, but only abnormal results are displayed) Labs Reviewed  COMPREHENSIVE METABOLIC PANEL - Abnormal; Notable for the following:       Result Value   CO2 21 (*)    All other components within normal limits  RAPID URINE DRUG SCREEN, HOSP PERFORMED - Abnormal; Notable for the following:    Tetrahydrocannabinol POSITIVE (*)    All other components within normal limits  ACETAMINOPHEN LEVEL - Abnormal; Notable for the following:    Acetaminophen (Tylenol), Serum <10 (*)    All other components within normal limits  CBC WITH DIFFERENTIAL/PLATELET  ETHANOL  SALICYLATE LEVEL  I-STAT TROPOININ, ED  I-STAT BETA HCG BLOOD, ED ([facility], [facility], [facility] ONLY)    EKG  EKG Interpretation None        Radiology Dg Chest 2 View  Result Date: 12/31/2015 CLINICAL DATA:  Chest pain and shortness of breath EXAM: CHEST  2 VIEW COMPARISON:  05/16/2015 chest radiograph. FINDINGS: Stable cardiomediastinal silhouette with normal heart size. No pneumothorax. No pleural effusion. Lungs appear clear, with no acute consolidative airspace disease and no pulmonary edema. IMPRESSION: No active cardiopulmonary disease. Electronically Signed   By: Delbert PhenixJason A Poff M.D.   On: 12/31/2015 17:07    Procedures Procedures (including critical care time)  Medications Ordered in ED Medications  hydrOXYzine (ATARAX/VISTARIL) tablet 50 mg (50 mg Oral Given 12/31/15 1626)     Initial Impression / Assessment and Plan / ED Course  I have reviewed the triage vital signs and the nursing notes.  Pertinent labs & imaging results that were available during my care of the patient were reviewed by me and considered in my medical decision making (see chart for details).  Clinical Course  as of Dec 30 2112  Sat Dec 31, 2015  1716 Normal cardiac silhouette. No evidence of consolidation, effusion, or PTX. No free air under diaphragm.  DG Chest 2 View [AM]  1912 On re-assessment patient is resting comfortably in bed. Vital signs are stable. She endorses improvement.   [AM]    Clinical Course User Index [AM] Lona KettleAshley Laurel Trevonne Nyland, PA-C    Patient presents to ED with complaint of panic attack with associated CP and SOB. Patient is afebrile and non-toxic appearing. She is tearful. She tachypneic and tachycardic; however, when she performs deep breathing her vital signs normalize to ~80s for HR and ~14 breaths/min. Blood pressure nml. No hypoxia. Lungs are CTABL. Abdomen +BS, soft, non-tender, non-distended. Vistaril given. Will check labs, EKG, and CXR. Will also order UDS, salicylate, acetaminophen, and ethanol level. Pt will need TTS consult.    EKG shows NSR, normal intervals, no axis deviation or hypertrophy, no ST/T wave  changes. CXR negative for PNA, pleural effusion, or PTX. Heart score 1. Low suspicion for ACS. Low suspicion at this time for PE - no recent long distance travel/surgery/hospitalization, h/o blood clots, h/o cancer, hemoptysis; no hypoxia; no lower leg swelling; no posterior calf tenderness; no evidence of right heart strain on EKG. Suspect chest pain and SOB secondary to panic attack. On re-evaluation patient is resting comfortably in bed. Vital signs are stable. She appears relaxed. Endorses improvement.   Labs are re-assuring. +THC. Patient is medically cleared. Dispo pending TTS consult.   8:49 PM: [facility] Venda [facility]odes[facility]ord [facility], greatly appreciate his time and input. Patient does not meet inpatient criteria. Recommend outpatient resources and [facility]   Discussed results and plan with patient. Rx vistaril for anxiety. Referral to [facility] provided. Strict return precautions given. Pt voiced understanding and is agreeable.   Final Clinical Impressions(s) / ED Diagnoses   Final diagnoses:  Panic attack  Anxiety    New Prescriptions New Prescriptions   HYDROXYZINE (ATARAX/VISTARIL) 25 MG TABLET    Take 1 tablet (25 mg total) by mouth every 8 (eight) hours as needed.     Lona Kettleshley Laurel Moneisha Vosler, [place] JerseyPA-C 12/31/15 2114    Rolland PorterMark James, MD 01/07/16 [phone]

## 2015-12-31 NOTE — ED Notes (Signed)
Patient transported to X-ray 

## 2015-12-31 NOTE — ED Triage Notes (Signed)
Pt. Having a panic attack.  Started last night she is having sob due to  Tachypnea.  Her friend that is with her reports that she has not been sleeping or eating.  She also has scratches down her face.  Pt./ will not answer any questions.  Very emotional.

## 2015-12-31 NOTE — ED Notes (Signed)
Pt doing TTS.  

## 2016-04-14 ENCOUNTER — Encounter (HOSPITAL_COMMUNITY): Payer: Self-pay | Admitting: Emergency Medicine

## 2016-04-14 ENCOUNTER — Emergency Department (HOSPITAL_COMMUNITY): Payer: Self-pay

## 2016-04-14 ENCOUNTER — Emergency Department (HOSPITAL_COMMUNITY)
Admission: EM | Admit: 2016-04-14 | Discharge: 2016-04-14 | Disposition: A | Discharge status: Home / Self Care | Payer: Self-pay | Attending: Emergency Medicine | Admitting: Emergency Medicine

## 2016-04-14 DIAGNOSIS — Y999 Unspecified external cause status: Secondary | ICD-10-CM | POA: Insufficient documentation

## 2016-04-14 DIAGNOSIS — W109XXA Fall (on) (from) unspecified stairs and steps, initial encounter: Secondary | ICD-10-CM | POA: Insufficient documentation

## 2016-04-14 DIAGNOSIS — Y939 Activity, unspecified: Secondary | ICD-10-CM | POA: Insufficient documentation

## 2016-04-14 DIAGNOSIS — Z79899 Other long term (current) drug therapy: Secondary | ICD-10-CM | POA: Insufficient documentation

## 2016-04-14 DIAGNOSIS — Y929 Unspecified place or not applicable: Secondary | ICD-10-CM | POA: Insufficient documentation

## 2016-04-14 DIAGNOSIS — M25562 Pain in left knee: Secondary | ICD-10-CM | POA: Insufficient documentation

## 2016-04-14 DIAGNOSIS — F17290 Nicotine dependence, other tobacco product, uncomplicated: Secondary | ICD-10-CM | POA: Insufficient documentation

## 2016-04-14 MED ORDER — OXYCODONE-ACETAMINOPHEN 5-325 MG PO TABS
1.0000 | ORAL_TABLET | Freq: Once | ORAL | Status: AC
  Administered 2016-04-14: 1 via ORAL
  Filled 2016-04-14: qty 1

## 2016-04-14 MED ORDER — IBUPROFEN 600 MG PO TABS: 600.0000 mg | ORAL_TABLET | Freq: Four times a day (QID) | ORAL | 0 refills | Status: DC | PRN
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
## 2016-04-14 NOTE — Discharge Instructions (Signed)
Please read and follow all provided instructions.  Your diagnoses today include:  1. Acute pain of left knee     Tests performed today include: Vital signs. See below for your results today.   Medications prescribed:  Take as prescribed   Home care instructions:  Follow any educational materials contained in this packet.  Follow-up instructions: Please follow-up with your Orthopedic provider for further evaluation of symptoms and treatment   Return instructions:  Please return to the Emergency Department if you do not get better, if you get worse, or new symptoms OR  - Fever (temperature greater than 101.62F)  - Bleeding that does not stop with holding pressure to the area    -Severe pain (please note that you may be more sore the day after your accident)  - Chest Pain  - Difficulty breathing  - Severe nausea or vomiting  - Inability to tolerate food and liquids  - Passing out  - Skin becoming red around your wounds  - Change in mental status (confusion or lethargy)  - New numbness or weakness    Please return if you have any other emergent concerns.  Additional Information:  Your vital signs today were: BP (!) 121/92 (BP Location: Left Arm)    Pulse 100    Temp 98.8 F (37.1 C) (Oral)    Resp 16    LMP 02/11/2016 Comment: "irregular" per pt.   SpO2 100%  If your blood pressure (BP) was elevated above 135/85 this visit, please have this repeated by your doctor within one month. ---------------

## 2016-04-14 NOTE — Progress Notes (Signed)
Orthopedic Tech Progress Note Patient Details:  Katherine Andrews 10/13/1990 [id number]  Ortho Devices Type of Ortho Device: Crutches, Knee Sleeve Ortho Device/Splint Interventions: Application   Saul FordyceJennifer C Raden Byington 04/14/2016, 5:42 PM

## 2016-04-14 NOTE — ED Notes (Signed)
No answer when called 

## 2016-04-14 NOTE — ED Notes (Signed)
In xray

## 2016-08-02 ENCOUNTER — Emergency Department (HOSPITAL_COMMUNITY)
Admission: EM | Admit: 2016-08-02 | Discharge: 2016-08-02 | Disposition: A | Discharge status: Home / Self Care | Payer: Medicaid Other | Attending: Emergency Medicine | Admitting: Emergency Medicine

## 2016-08-02 ENCOUNTER — Emergency Department (HOSPITAL_COMMUNITY): Payer: Medicaid Other

## 2016-08-02 ENCOUNTER — Encounter (HOSPITAL_COMMUNITY): Payer: Self-pay | Admitting: Emergency Medicine

## 2016-08-02 ENCOUNTER — Other Ambulatory Visit: Payer: Self-pay

## 2016-08-02 DIAGNOSIS — F419 Anxiety disorder, unspecified: Secondary | ICD-10-CM | POA: Insufficient documentation

## 2016-08-02 DIAGNOSIS — J40 Bronchitis, not specified as acute or chronic: Secondary | ICD-10-CM

## 2016-08-02 DIAGNOSIS — F17290 Nicotine dependence, other tobacco product, uncomplicated: Secondary | ICD-10-CM | POA: Insufficient documentation

## 2016-08-02 DIAGNOSIS — R072 Precordial pain: Secondary | ICD-10-CM | POA: Insufficient documentation

## 2016-08-02 HISTORY — DX: Anxiety disorder, unspecified: F41.9

## 2016-08-02 LAB — CBC WITH DIFFERENTIAL/PLATELET
BASOS PCT: 0 %
Basophils Absolute: 0 10*3/uL (ref 0.0–0.1)
EOS ABS: 0.3 10*3/uL (ref 0.0–0.7)
Eosinophils Relative: 3 %
HEMATOCRIT: 38.1 % (ref 36.0–46.0)
HEMOGLOBIN: 12.3 g/dL (ref 12.0–15.0)
Lymphocytes Relative: 14 %
Lymphs Abs: 1.5 10*3/uL (ref 0.7–4.0)
MCH: 27.6 pg (ref 26.0–34.0)
MCHC: 32.3 g/dL (ref 30.0–36.0)
MCV: 85.4 fL (ref 78.0–100.0)
Monocytes Absolute: 0.8 10*3/uL (ref 0.1–1.0)
Monocytes Relative: 8 %
NEUTROS ABS: 8 10*3/uL — AB (ref 1.7–7.7)
NEUTROS PCT: 75 %
Platelets: 291 10*3/uL (ref 150–400)
RBC: 4.46 MIL/uL (ref 3.87–5.11)
RDW: 13.7 % (ref 11.5–15.5)
WBC: 10.5 10*3/uL (ref 4.0–10.5)

## 2016-08-02 LAB — BASIC METABOLIC PANEL
ANION GAP: 10 (ref 5–15)
BUN: 11 mg/dL (ref 6–20)
CALCIUM: 9.2 mg/dL (ref 8.9–10.3)
CO2: 22 mmol/L (ref 22–32)
CREATININE: 0.87 mg/dL (ref 0.44–1.00)
Chloride: 105 mmol/L (ref 101–111)
Glucose, Bld: 92 mg/dL (ref 65–99)
Potassium: 3.2 mmol/L — ABNORMAL LOW (ref 3.5–5.1)
Sodium: 137 mmol/L (ref 135–145)

## 2016-08-02 LAB — D-DIMER, QUANTITATIVE: D-Dimer, Quant: 0.35 ug/mL-FEU (ref 0.00–0.50)

## 2016-08-02 LAB — I-STAT TROPONIN, ED: TROPONIN I, POC: 0 ng/mL (ref 0.00–0.08)

## 2016-08-02 MED ORDER — AZITHROMYCIN 250 MG PO TABS: 250.0000 mg | ORAL_TABLET | Freq: Every day | ORAL | 0 refills | Status: DC

## 2016-08-02 MED ORDER — BENZONATATE 100 MG PO CAPS: 100.0000 mg | ORAL_CAPSULE | Freq: Three times a day (TID) | ORAL | 0 refills | Status: DC

## 2016-08-02 MED ORDER — ALBUTEROL SULFATE HFA 108 (90 BASE) MCG/ACT IN AERS: 1.0000 | INHALATION_SPRAY | Freq: Four times a day (QID) | RESPIRATORY_TRACT | 0 refills | Status: AC | PRN

## 2016-08-02 NOTE — ED Triage Notes (Signed)
Pt sts cough starting yesterday and pain with cough; pt hyperventilating at present and sts hx of anxiety

## 2016-08-02 NOTE — ED Notes (Signed)
PT is in stable condition upon d/c and ambulates from ED. 

## 2016-08-02 NOTE — ED Provider Notes (Signed)
Emergency Department Provider Note   I have reviewed the triage vital signs and the nursing notes.   HISTORY  Chief Complaint Cough and Anxiety   HPI Katherine Andrews is a 26 y.o. female with PMH of anxiety and tobacco use presents to the emergency department for evaluation of painful coughing over the past 2 days. Her symptoms are gradually worsening. She describes a central chest pain constantly it's worse with coughing. Cough has been minimally productive. She denies fevers but has had some shaking chills. Patient is a current smoker. No sick contacts. No abdominal pain, nausea, vomiting, diarrhea. Patient reports that with painful coughing she feels on the verge of having an "anxiety attack." No history of blood clots in her legs or lungs. No recent surgeries. Patient tried a sibling's inhaler with no relief in symptoms.   Past Medical History:  Diagnosis Date  . Anxiety   . BV (bacterial vaginosis)   . Chlamydia   . GERD (gastroesophageal reflux disease)   . Gonorrhea   . UTI (lower urinary tract infection)     Patient Active Problem List   Diagnosis Date Noted  . UTI (lower urinary tract infection)     Past Surgical History:  Procedure Laterality Date  . NO PAST SURGERIES      Current Outpatient Rx  . Order #: [id number] Class: Print  . Order #: [id number] Class: Print  . Order #: [id number] Class: Print  . Order #: [id number] Class: Print  . Order #: [id number] Class: Print  . Order #: [id number] Class: Print    Allergies Pineapple  History reviewed. No pertinent family history.  Social History Social History  Substance Use Topics  . Smoking status: Current Every Day Smoker    Types: Cigars  . Smokeless tobacco: Never Used  . Alcohol use No    Review of Systems  Constitutional: No fever/chills Eyes: No visual changes. ENT: No sore throat. Cardiovascular: Positive chest pain. Respiratory: Denies shortness of breath. Positive cough.  Gastrointestinal:  No abdominal pain.  No nausea, no vomiting.  No diarrhea.  No constipation. Genitourinary: Negative for dysuria. Musculoskeletal: Negative for back pain. Skin: Negative for rash. Neurological: Negative for headaches, focal weakness or numbness.  10-point ROS otherwise negative.  ____________________________________________   PHYSICAL EXAM:  VITAL SIGNS: ED Triage Vitals  Enc Vitals Group     BP 08/02/16 0802 134/87     Pulse Rate 08/02/16 0802 (!) 123     Resp 08/02/16 0802 20     Temp 08/02/16 0802 99.6 F (37.6 C)     Temp Source 08/02/16 0802 Oral     SpO2 08/02/16 0802 100 %     Pain Score 08/02/16 0803 10   Constitutional: Alert and oriented. Curled up in bed, sleeping when I enter the room but easy to arouse.  Eyes: Conjunctivae are normal. Head: Atraumatic. Nose: No congestion/rhinnorhea. Mouth/Throat: Mucous membranes are moist.  Oropharynx non-erythematous. Neck: No stridor. Cardiovascular: Sinus tachycardia. Good peripheral circulation. Grossly normal heart sounds.   Respiratory: Normal respiratory effort.  No retractions. Lungs CTAB. No wheezing.  Gastrointestinal: Soft and nontender. No distention.  Musculoskeletal: No lower extremity tenderness nor edema. No gross deformities of extremities. Neurologic:  Normal speech and language. No gross focal neurologic deficits are appreciated.  Skin:  Skin is warm, dry and intact. No rash noted. Psychiatric: Mood and affect are normal. Speech and behavior are normal.  ____________________________________________   LABS (all labs ordered are listed, but only abnormal results are displayed)  Labs Reviewed  BASIC  METABOLIC PANEL - Abnormal; Notable for the following:       Result Value   Potassium 3.2 (*)    All other components within normal limits  CBC WITH DIFFERENTIAL/PLATELET - Abnormal; Notable for the following:    Neutro Abs 8.0 (*)    All other components within normal limits  D-DIMER, QUANTITATIVE (NOT AT  [facility])  I-STAT TROPOININ, ED   ____________________________________________  EKG   EKG Interpretation  Date/Time:  Thursday August 02 2016 10:25:20 EDT Ventricular Rate:  82 PR Interval:    QRS Duration: 75 QT Interval:  345 QTC Calculation: 403 R Axis:   59 Text Interpretation:  Sinus rhythm No STEMI.  Confirmed by Alona Bene [phone]) on 08/02/2016 11:41:21 AM       ____________________________________________  RADIOLOGY  Dg Chest 2 View  Result Date: 08/02/2016 CLINICAL DATA:  26 year old presenting with cough and shortness of breath that began acutely yesterday. Current smoker. EXAM: CHEST  2 VIEW COMPARISON:  12/31/2015, 05/16/2015 and earlier. FINDINGS: Cardiomediastinal silhouette unremarkable. Lungs clear. Bronchovascular markings normal. Pulmonary vascularity normal. No pneumothorax. No pleural effusions. Visualized bony thorax intact. No interval change. IMPRESSION: Normal examination. Electronically Signed   By: Hulan Saas M.D.   On: 08/02/2016 08:27    ____________________________________________   PROCEDURES  Procedure(s) performed:   Procedures  None ____________________________________________   INITIAL IMPRESSION / ASSESSMENT AND PLAN / ED COURSE  Pertinent labs & imaging results that were available during my care of the patient were reviewed by me and considered in my medical decision making (see chart for details).  Patient presents to the emergency department for evaluation of chest pain worse with coughing and slight exacerbation of underlying anxiety. Symptoms seem most consistent with an acute bronchitis. She does describe pain when not coughing and states it has a slight pleuritic component. Although I have low suspicion for PE and the patient is otherwise low risk I cannot apply the PERC rule in this case as the patient has sinus tachycardia. Plan to obtain labs including troponin and d-dimer.   I counseled the patient specifically on the  need for smoking cessation and that it may be contribute into her bronchitis-like symptoms. She is currently in the pre-contemplative stage.   11:40 AM X-ray and lab work is negative. Advise patient stop smoking and will treat for bronchitis with azithromycin, albuterol, Tessalon. No steroid at this time.   At this time, I do not feel there is any life-threatening condition present. I have reviewed and discussed all results (EKG, imaging, lab, urine as appropriate), exam findings with patient. I have reviewed nursing notes and appropriate previous records.  I feel the patient is safe to be discharged home without further emergent workup. Discussed usual and customary return precautions. Patient and family (if present) verbalize understanding and are comfortable with this plan.  Patient will follow-up with their primary care provider. If they do not have a primary care provider, information for follow-up has been provided to them. All questions have been answered.  ____________________________________________  FINAL CLINICAL IMPRESSION(S) / ED DIAGNOSES  Final diagnoses:  Bronchitis  Precordial chest pain     MEDICATIONS GIVEN DURING THIS VISIT:  Medications - No data to display   NEW OUTPATIENT MEDICATIONS STARTED DURING THIS VISIT:  New Prescriptions   ALBUTEROL (PROVENTIL HFA;VENTOLIN HFA) 108 (90 BASE) MCG/ACT INHALER    Inhale 1-2 puffs into the lungs every 6 (six) hours as needed for wheezing or shortness of breath.   AZITHROMYCIN (ZITHROMAX) 250 MG  TABLET    Take 1 tablet (250 mg total) by mouth daily. Take first 2 tablets together, then 1 every day until finished.   BENZONATATE (TESSALON) 100 MG CAPSULE    Take 1 capsule (100 mg total) by mouth every 8 (eight) hours.      Note:  This document was prepared using Dragon voice recognition software and may include unintentional dictation errors.  Alona BeneJoshua Long, MD Emergency Medicine    Long, Arlyss RepressJoshua G, MD 08/02/16 1141

## 2016-08-02 NOTE — Discharge Instructions (Signed)

## 2016-08-02 NOTE — ED Notes (Signed)
Pt is crying and shaking upon assessment. Pt states she's trying not to have a panic attack rt her hurting so bad. Pt wouldn't describe where she is hurting, but continues to cry and hyperventilate. Pt asked to breathe in through her nose and out through her mouth.

## 2017-08-04 IMAGING — US US ART/VEN ABD/PELV/SCROTUM DOPPLER LTD
1 series · 13 of 25 positions shown · non-contrast
Comparison: None.

:
CLINICAL DATA: Pelvic pain.

EXAM:TRANSABDOMINAL AND TRANSVAGINAL ULTRASOUND OF PELVISDOPPLER
ULTRASOUND OF OVARIES
TECHNIQUE: Both transabdominal and transvaginal ultrasound
examinations of thepelvis were performed. Transabdominal technique
was performed forglobal imaging of the pelvis including uterus,
ovaries, adnexalregions, and pelvic cul-de-sac.It was necessary to
proceed with endovaginal exam following thetransabdominal exam to
visualize the uterus and ovaries. Color andduplex Doppler ultrasound
was utilized to evaluate blood flow to theOvaries.

[Series 1: us art/ven abd/pelv/scrotum doppler ltd · 0.21mm/px · 13 of 39 slices shown]
[im 1/39]
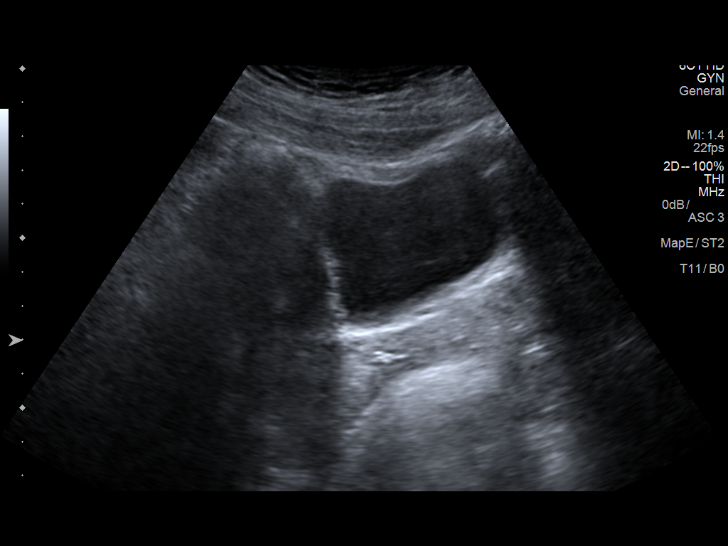
[im 4/39]
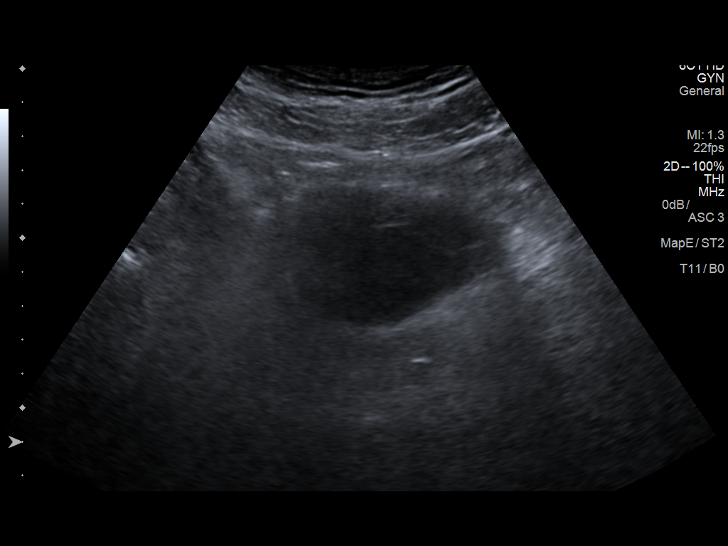
[im 7/39]
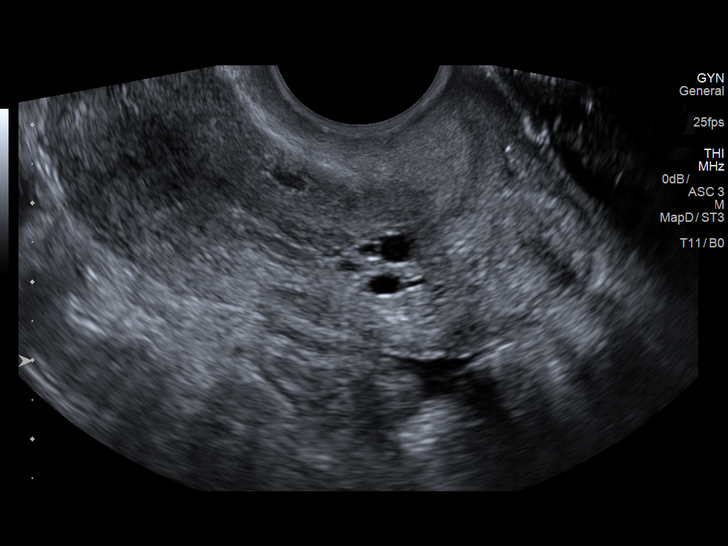
[im 10/39]
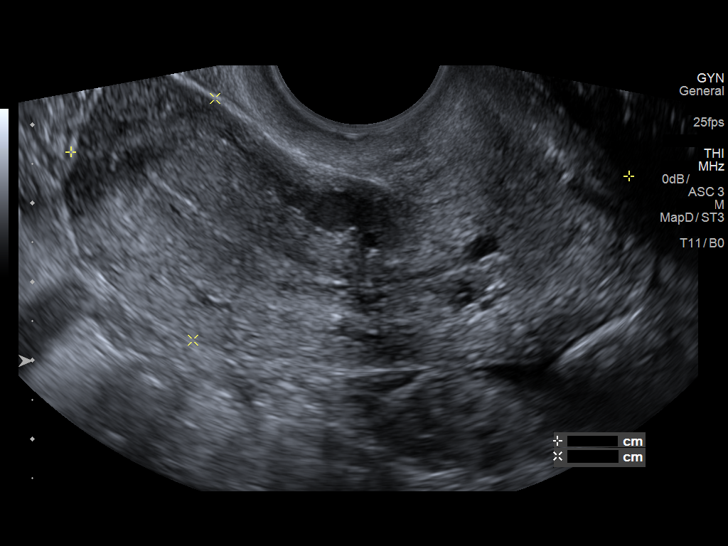
[im 13/39]
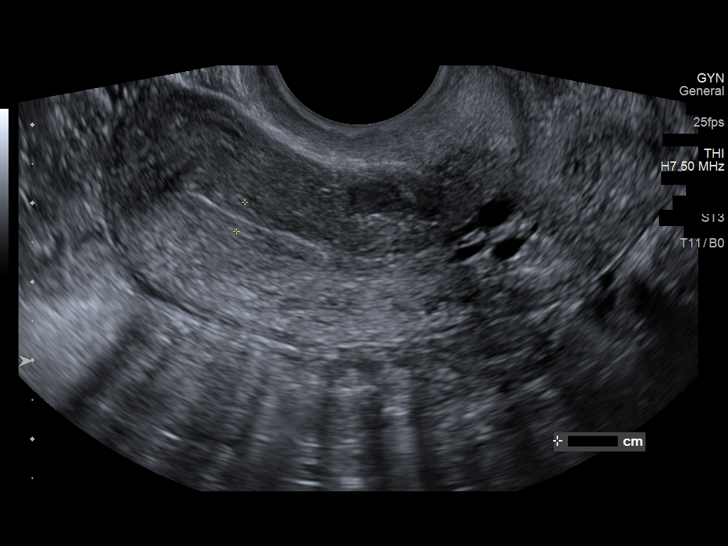
[im 16/39]
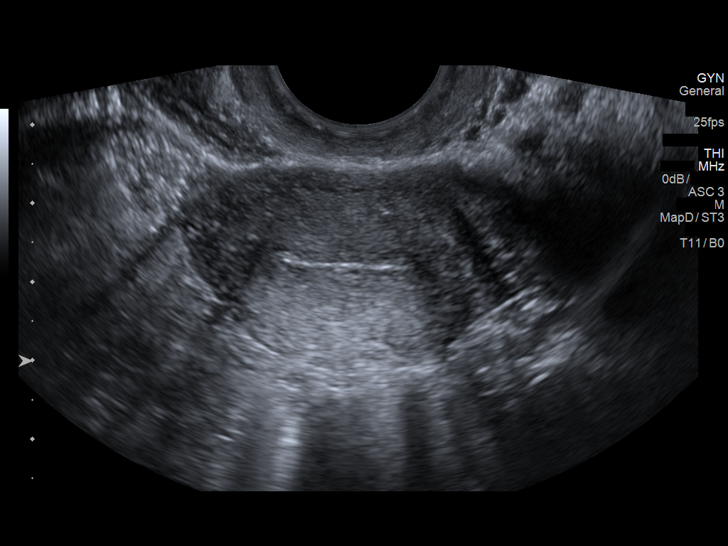
[im 20/39]
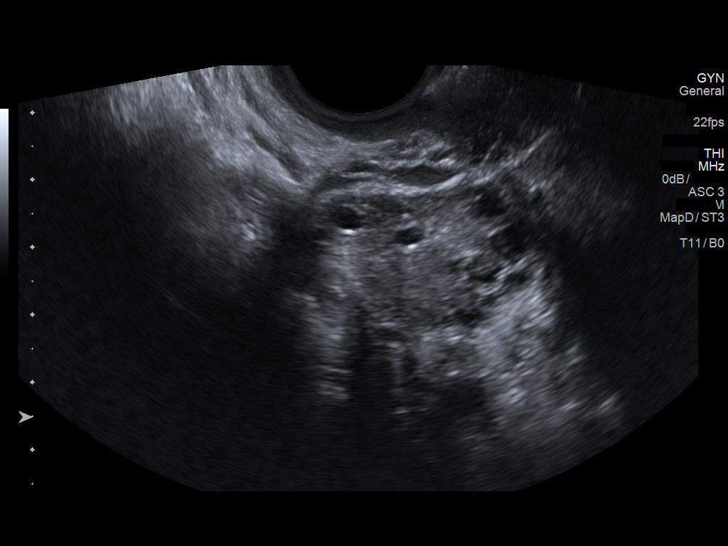
[im 23/39]
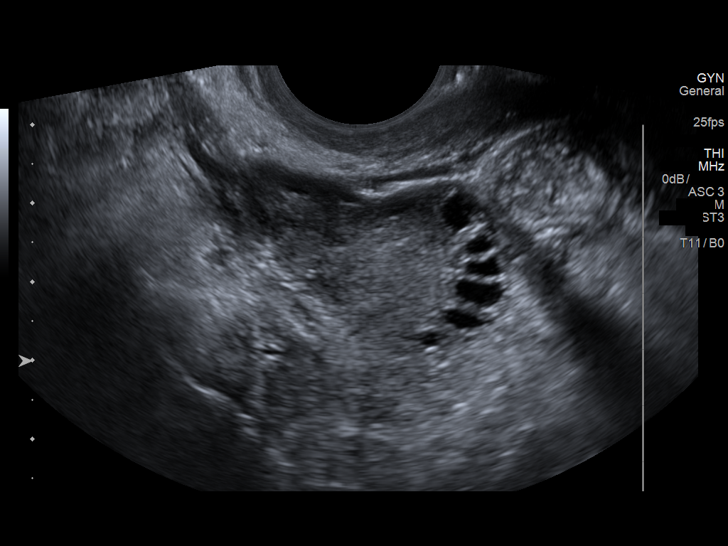
[im 26/39]
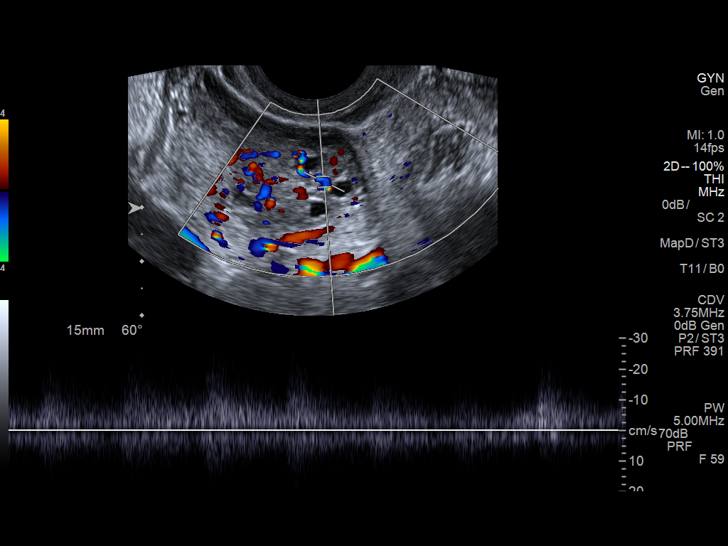
[im 29/39]
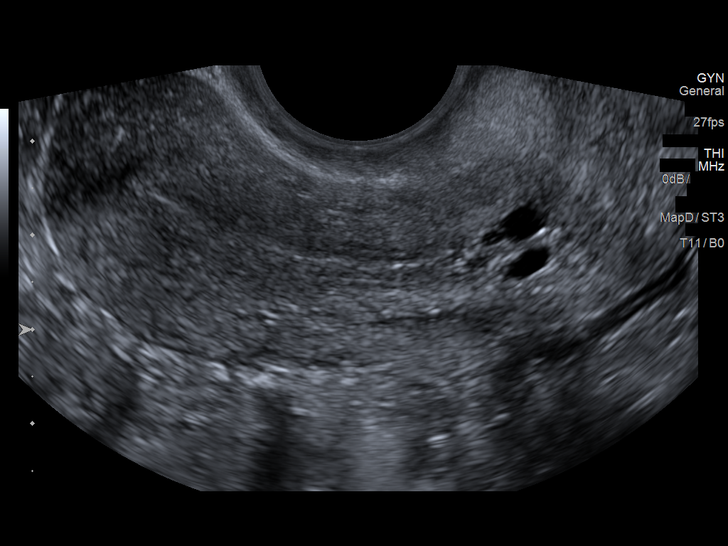
[im 32/39]
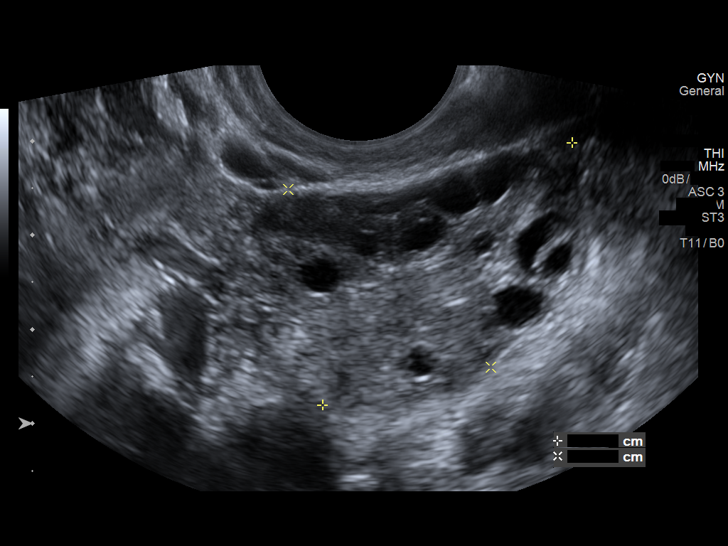
[im 35/39]
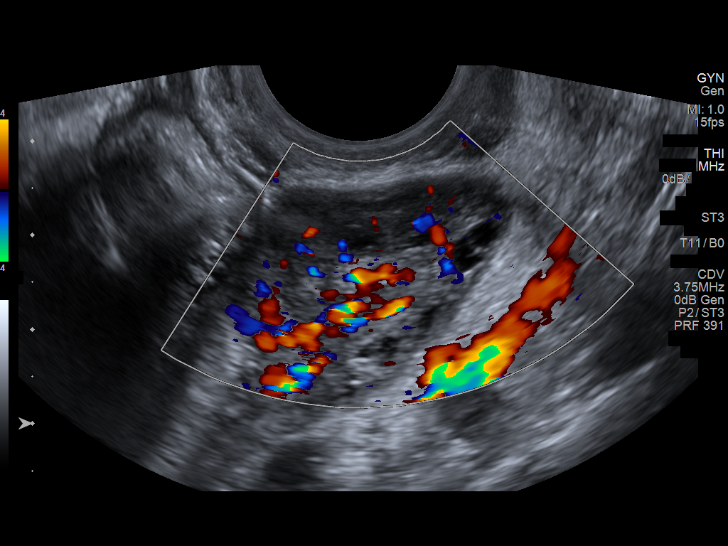
[im 39/39]
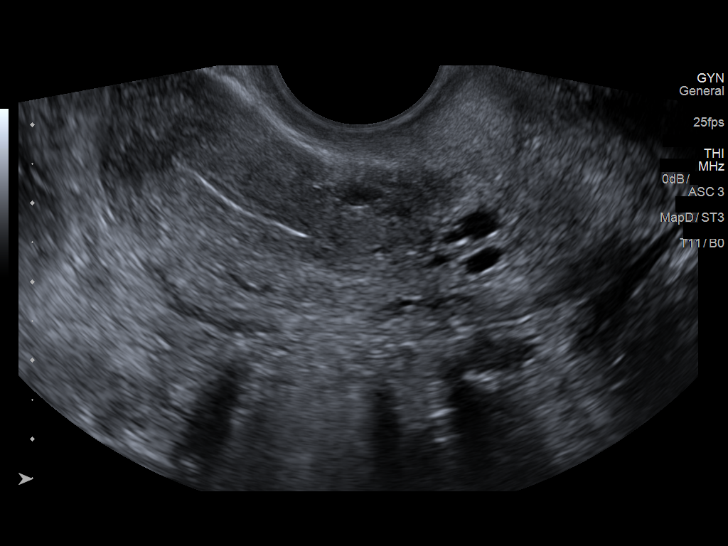

[13 of 25 positions shown; findings below may reference images not displayed]

FINDINGS: UterusMeasurements: 7.1 x 3.1 x 3.7 cm. Uterus is
anteverted. No fibroidsor other mass visualized. Nabothian cysts in
the cervix.

EndometriumThickness: 3.9 mm.  No focal abnormality visualized.

Right ovaryMeasurements: 3.9 x 2.4 x 3 cm. Normal appearance/no
adnexal mass.

Left ovaryMeasurements: 3.8 x 2.9 x 2.3 cm. Normal appearance/no
adnexal mass.

Pulsed Doppler evaluation of both ovaries demonstrates
normallow-resistance arterial and venous waveforms. Flow is
demonstratedwithin both ovaries on color flow Doppler imaging.

Other findingsNo abnormal free fluid.
IMPRESSION: Normal ultrasound appearance of the uterus and ovaries.
No evidenceof ovarian mass or torsion.

## 2018-01-22 HISTORY — PX: EYE SURGERY: SHX253

## 2018-02-08 ENCOUNTER — Encounter (HOSPITAL_COMMUNITY): Payer: Self-pay | Admitting: Emergency Medicine

## 2018-02-08 ENCOUNTER — Other Ambulatory Visit: Payer: Self-pay

## 2018-02-08 ENCOUNTER — Emergency Department (HOSPITAL_COMMUNITY)
Admission: EM | Admit: 2018-02-08 | Discharge: 2018-02-08 | Disposition: A | Discharge status: Home / Self Care | Payer: Medicaid - Out of State | Attending: Emergency Medicine | Admitting: Emergency Medicine

## 2018-02-08 DIAGNOSIS — F1290 Cannabis use, unspecified, uncomplicated: Secondary | ICD-10-CM | POA: Diagnosis not present

## 2018-02-08 DIAGNOSIS — R300 Dysuria: Secondary | ICD-10-CM | POA: Insufficient documentation

## 2018-02-08 DIAGNOSIS — R112 Nausea with vomiting, unspecified: Secondary | ICD-10-CM | POA: Diagnosis not present

## 2018-02-08 DIAGNOSIS — R1033 Periumbilical pain: Secondary | ICD-10-CM | POA: Diagnosis present

## 2018-02-08 DIAGNOSIS — F17210 Nicotine dependence, cigarettes, uncomplicated: Secondary | ICD-10-CM | POA: Insufficient documentation

## 2018-02-08 DIAGNOSIS — R350 Frequency of micturition: Secondary | ICD-10-CM | POA: Diagnosis not present

## 2018-02-08 DIAGNOSIS — R102 Pelvic and perineal pain: Secondary | ICD-10-CM

## 2018-02-08 LAB — URINALYSIS, ROUTINE W REFLEX MICROSCOPIC
BACTERIA UA: NONE SEEN
BILIRUBIN URINE: NEGATIVE
Glucose, UA: NEGATIVE mg/dL
Ketones, ur: NEGATIVE mg/dL
LEUKOCYTES UA: NEGATIVE
Nitrite: NEGATIVE
PROTEIN: NEGATIVE mg/dL
Specific Gravity, Urine: 1.026 (ref 1.005–1.030)
pH: 5 (ref 5.0–8.0)

## 2018-02-08 LAB — WET PREP, GENITAL
Clue Cells Wet Prep HPF POC: NONE SEEN
Sperm: NONE SEEN
Trich, Wet Prep: NONE SEEN
Yeast Wet Prep HPF POC: NONE SEEN

## 2018-02-08 LAB — POC URINE PREG, ED: Preg Test, Ur: NEGATIVE

## 2018-02-08 MED ORDER — ACETAMINOPHEN 325 MG PO TABS
650.0000 mg | ORAL_TABLET | Freq: Once | ORAL | Status: AC
  Administered 2018-02-08: 650 mg via ORAL
  Filled 2018-02-08: qty 2

## 2018-02-08 NOTE — ED Triage Notes (Signed)
Pt states she has been vomiting since the middle of November. Pt states there is mucus in her vomit. Pt states there is a possibly she could be pregnant

## 2018-02-08 NOTE — ED Notes (Signed)
Patient verbalizes understanding of discharge instructions. Opportunity for questioning and answers were provided. Armband removed by staff, pt discharged from ED ambulatory to home.  

## 2018-02-08 NOTE — Discharge Instructions (Signed)
Your wet prep did not show signs of bacterial vaginosis, trichomonas, or yeast infection.   You have been tested for HIV, syphilis, chlamydia and gonorrhea.  These results will be available in approximately 3 days and you will be contacted by the hospital if the results are positive. Avoid sexual contact until you are aware of the results, and please inform all sexual partners if you test positive for any of these diseases.  If you were given a prescription, please take the prescription as you were instructed and follow the directions given on the discharge paperwork.   Over the next several days you should rest as much as possible, and drink more fluids than usual. Liquids will help thin and loosen mucus so you can cough it up. Liquids will also help prevent dehydration. Using a cool mist humidifier or a vaporizer to increase air moisture in your home can also make it easier for you to breathe and help decrease your cough.  To help soothe a sore throat gargle with warm salt water.  Make salt water by dissolving  teaspoon salt in 1 cup warm water. You may also use throat lozenges and over the counter sore throat spray.  Please follow up with your primary care provider within 5-7 days for re-evaluation of your symptoms. If you do not have a primary care provider, information for a healthcare clinic has been provided for you to make arrangements for follow up care. Please return to the emergency department for any persistent fevers, worsening sore throat/hoarse voice, inability to swallow, persistent vomiting, chest pain, shortness of breath, coughing up blood, or any new or worsening symptoms.  These follow-up with your OB/GYN for reevaluation and return to ER for new or worsening symptoms.

## 2018-02-08 NOTE — ED Provider Notes (Addendum)
[facility] EMERGENCY DEPARTMENT Provider Note   CSN: [id number] Arrival date & time: 02/08/18  1255     History   Chief Complaint No chief complaint on file.   HPI Katherine Andrews is a 28 y.o. female.  HPI   Pt is a 28 y/o female who presents to the ED today c/o lower abd pain that began 2 weeks ago. Pain fees like constant stabbing/cramping pain. Pain is worse when she urinates. She endorses dysuria, frequency, and urgency. Denies vaginal discharge. Reports malodorous urine.   She endorses 1-2 episodes of vomiting per week. Last episode of vomiting was last night. Nausea is also present but is intermittent. Is c/o some diarrhea (x1/day) for several weeks. No constipation. No fevers.  Pt is in a monogamous relationship with her fiance and states she has no concern for STI.  Past Medical History:  Diagnosis Date  . Anxiety   . BV (bacterial vaginosis)   . Chlamydia   . GERD (gastroesophageal reflux disease)   . Gonorrhea   . UTI (lower urinary tract infection)     Patient Active Problem List   Diagnosis Date Noted  . UTI (lower urinary tract infection)     Past Surgical History:  Procedure Laterality Date  . NO PAST SURGERIES       OB History    Gravida  0   Para      Term      Preterm      AB      Living        SAB      TAB      Ectopic      Multiple      Live Births               Home Medications    Prior to Admission medications   Medication Sig Start Date End Date Taking? Authorizing Provider  albuterol (PROVENTIL HFA;VENTOLIN HFA) 108 (90 Base) MCG/ACT inhaler Inhale 1-2 puffs into the lungs every 6 (six) hours as needed for wheezing or shortness of breath. 08/02/16   Long, Arlyss RepressJoshua G, MD  azithromycin (ZITHROMAX) 250 MG tablet Take 1 tablet (250 mg total) by mouth daily. Take first 2 tablets together, then 1 every day until finished. 08/02/16   Long, Arlyss RepressJoshua G, MD  benzonatate (TESSALON) 100 MG capsule Take 1  capsule (100 mg total) by mouth every 8 (eight) hours. 08/02/16   Long, Arlyss RepressJoshua G, MD  cephALEXin (KEFLEX) 500 MG capsule Take 1 capsule (500 mg total) by mouth 4 (four) times daily. Patient not taking: Reported on 12/31/2015 08/01/15   Ward, Layla MawKristen N, DO  hydrOXYzine (ATARAX/VISTARIL) 25 MG tablet Take 1 tablet (25 mg total) by mouth every 8 (eight) hours as needed. Patient not taking: Reported on 08/02/2016 12/31/15   Deborha PaymentMeyer, Ashley L, PA-C  ibuprofen (ADVIL,MOTRIN) 600 MG tablet Take 1 tablet (600 mg total) by mouth every 6 (six) hours as needed. Patient taking differently: Take 600 mg by mouth every 6 (six) hours as needed for mild pain.  04/14/16   Audry PiliMohr, Tyler, PA-C    Family History No family history on file.  Social History Social History   Tobacco Use  . Smoking status: Current Every Day Smoker    Types: Cigars  . Smokeless tobacco: Never Used  Substance Use Topics  . Alcohol use: Yes  . Drug use: Yes    Frequency: 3.0 times per week    Types: Marijuana  Allergies   Pineapple   Review of Systems Review of Systems  Constitutional: Negative for fever.  HENT: Negative for congestion.   Eyes: Negative for visual disturbance.  Respiratory: Negative for cough and shortness of breath.   Cardiovascular: Negative for chest pain.  Gastrointestinal: Positive for abdominal pain, diarrhea, nausea and vomiting. Negative for blood in stool and constipation.  Genitourinary: Positive for dysuria, frequency, pelvic pain and urgency. Negative for hematuria, vaginal bleeding and vaginal discharge.  Musculoskeletal: Negative for myalgias.  Skin: Negative for rash.  Neurological: Negative for headaches.     Physical Exam Updated Vital Signs BP (!) 126/93 (BP Location: Right Arm)   Pulse 79   Temp 98.4 F (36.9 C) (Oral)   Resp 16   Ht 5\' 4"  (1.626 m)   Wt 83.9 kg   SpO2 97%   BMI 31.76 kg/m   Physical Exam Vitals signs and nursing note reviewed.  Constitutional:       General: She is not in acute distress.    Appearance: Normal appearance. She is well-developed. She is not ill-appearing or toxic-appearing.  HENT:     Head: Normocephalic and atraumatic.  Eyes:     Conjunctiva/sclera: Conjunctivae normal.  Neck:     Musculoskeletal: Neck supple.  Cardiovascular:     Rate and Rhythm: Normal rate and regular rhythm.     Heart sounds: Normal heart sounds. No murmur.  Pulmonary:     Effort: Pulmonary effort is normal. No respiratory distress.     Breath sounds: Normal breath sounds.  Abdominal:     General: Bowel sounds are normal. There is no distension.     Palpations: Abdomen is soft.     Tenderness: There is no right CVA tenderness, left CVA tenderness or guarding.     Comments: Very mild suprapubic TTP.   Genitourinary:    Comments: Exam performed by Karrie Meresortni S Kenishia Plack,  exam chaperoned Date: 02/08/2018 Pelvic exam: normal external genitalia without evidence of trauma. VULVA: normal appearing vulva with no masses, tenderness or lesion. VAGINA: normal appearing vagina with normal color and discharge, no lesions. CERVIX: cervix is somewhat erythematous, cervical motion tenderness absent, cervical os closed with out purulent discharge; vaginal discharge, Wet prep and DNA probe for chlamydia and GC obtained.   ADNEXA: normal adnexa in size, nontender and no masses UTERUS: uterus is normal size, shape, consistency and nontender.  Skin:    General: Skin is warm and dry.  Neurological:     Mental Status: She is alert.  Psychiatric:        Mood and Affect: Mood normal.    ED Treatments / Results  Labs (all labs ordered are listed, but only abnormal results are displayed) Labs Reviewed  WET PREP, GENITAL - Abnormal; Notable for the following components:      Result Value   WBC, Wet Prep HPF POC MANY (*)    All other components within normal limits  URINALYSIS, ROUTINE W REFLEX MICROSCOPIC - Abnormal; Notable for the following components:   Hgb  urine dipstick SMALL (*)    All other components within normal limits  URINE CULTURE  POC URINE PREG, ED  GC/CHLAMYDIA PROBE AMP ([place]    EKG None  Radiology No results found.  Procedures Procedures (including critical care time)  Medications Ordered in ED Medications  acetaminophen (TYLENOL) tablet 650 mg (650 mg Oral Given 02/08/18 1609)     Initial Impression / Assessment and Plan / ED Course  I have  reviewed the triage vital signs and the nursing notes.  Pertinent labs & imaging results that were available during my care of the patient were reviewed by me and considered in my medical decision making (see chart for details).     Final Clinical Impressions(s) / ED Diagnoses   Final diagnoses:  Suprapubic pain   Presenting complaining of suprapubic pain that has been present for the last 2 weeks and comes and goes.  She also has urinary symptoms.  She denies vaginal discharge or concern for STD as she is in a monogamous relationship with her fianc.  Her vital signs are stable and she is afebrile.  Her abdomen is soft and she has very mild suprapubic tenderness.  She has no rebound rigidity or guarding.  Pelvic exam without uterine or adnexal tenderness.  There is no discharge present.  Her cervix is mildly friable.  She has no cervical motion tenderness.  GC chlamydia obtained.  Wet prep obtained.  Wet prep with white blood cells.  UA with hematuria but no leukocytes or nitrites.  Given that her pelvic exam was very reassuring, do not feel that imaging is necessary at this time and feel that patient is appropriate for outpatient follow-up for her symptoms.  She is comfortable with this plan.  She has an OB/GYN.  I also gave her information for women's clinic.  I advised her that if she has new or worsening symptoms then she needs to return to the emergency department immediately.  She voiced understanding the plan and reasons to return.  All questions answered.   Patient stable for discharge.  ED Discharge Orders    None       Karrie Meres, PA-C 02/08/18 [address], PA-C 02/08/18 1644    Derwood Kaplan, MD 02/09/18 1116

## 2018-02-09 LAB — URINE CULTURE

## 2018-02-10 LAB — GC/CHLAMYDIA PROBE AMP (~~LOC~~) NOT AT ARMC
CHLAMYDIA, DNA PROBE: NEGATIVE
Neisseria Gonorrhea: NEGATIVE

## 2019-03-09 ENCOUNTER — Encounter (HOSPITAL_COMMUNITY): Payer: Self-pay | Admitting: Emergency Medicine

## 2019-03-09 ENCOUNTER — Emergency Department (HOSPITAL_COMMUNITY)
Admission: EM | Admit: 2019-03-09 | Discharge: 2019-03-09 | Disposition: A | Discharge status: Home / Self Care | Payer: Medicaid - Out of State | Attending: Emergency Medicine | Admitting: Emergency Medicine

## 2019-03-09 ENCOUNTER — Other Ambulatory Visit: Payer: Self-pay

## 2019-03-09 DIAGNOSIS — K047 Periapical abscess without sinus: Secondary | ICD-10-CM | POA: Insufficient documentation

## 2019-03-09 DIAGNOSIS — F17290 Nicotine dependence, other tobacco product, uncomplicated: Secondary | ICD-10-CM | POA: Insufficient documentation

## 2019-03-09 DIAGNOSIS — K0520 Aggressive periodontitis, unspecified: Secondary | ICD-10-CM | POA: Insufficient documentation

## 2019-03-09 MED ORDER — HYDROCODONE-ACETAMINOPHEN 5-325 MG PO TABS: ORAL_TABLET | ORAL | 0 refills | Status: DC

## 2019-03-09 MED ORDER — PENICILLIN V POTASSIUM 500 MG PO TABS: 500.0000 mg | ORAL_TABLET | Freq: Three times a day (TID) | ORAL | 0 refills | Status: DC

## 2019-03-09 MED ORDER — PENICILLIN V POTASSIUM 250 MG PO TABS
500.0000 mg | ORAL_TABLET | Freq: Once | ORAL | Status: AC
  Administered 2019-03-09: 19:00:00 500 mg via ORAL
  Filled 2019-03-09: qty 2

## 2019-03-09 MED ORDER — HYDROCODONE-ACETAMINOPHEN 5-325 MG PO TABS
1.0000 | ORAL_TABLET | Freq: Once | ORAL | Status: AC
  Administered 2019-03-09: 1 via ORAL
  Filled 2019-03-09: qty 1

## 2019-03-09 NOTE — ED Notes (Signed)
Called pt twice for triage with no response.

## 2019-03-09 NOTE — ED Notes (Signed)
Patient verbalizes understanding of discharge instructions. Opportunity for questioning and answers were provided. Armband removed by staff. Patient discharged from ED. Signature pad not working  

## 2019-03-09 NOTE — ED Provider Notes (Signed)
[facility] EMERGENCY DEPARTMENT Provider Note   CSN: [id number] Arrival date & time: 03/09/19  1626     History Chief Complaint  Patient presents with  . dental abscess    Katherine Andrews is a 29 y.o. female.  Patient presents the emergency department today with complaint of right lower dental pain ongoing over the past week, worsening.  No fevers.  Difficulty chewing due to pain.  Patient has been taking 600 mg ibuprofens without much improvement.  She has some subjective swelling.  No vomiting.  No ear pain.        Past Medical History:  Diagnosis Date  . Anxiety   . BV (bacterial vaginosis)   . Chlamydia   . GERD (gastroesophageal reflux disease)   . Gonorrhea   . UTI (lower urinary tract infection)     Patient Active Problem List   Diagnosis Date Noted  . UTI (lower urinary tract infection)     Past Surgical History:  Procedure Laterality Date  . NO PAST SURGERIES       OB History    Gravida  0   Para      Term      Preterm      AB      Living        SAB      TAB      Ectopic      Multiple      Live Births              No family history on file.  Social History   Tobacco Use  . Smoking status: Current Every Day Smoker    Types: Cigars  . Smokeless tobacco: Never Used  Substance Use Topics  . Alcohol use: Yes  . Drug use: Yes    Frequency: 3.0 times per week    Types: Marijuana    Home Medications Prior to Admission medications   Medication Sig Start Date End Date Taking? Authorizing Provider  albuterol (PROVENTIL HFA;VENTOLIN HFA) 108 (90 Base) MCG/ACT inhaler Inhale 1-2 puffs into the lungs every 6 (six) hours as needed for wheezing or shortness of breath. 08/02/16   Long, Arlyss Repress, MD  azithromycin (ZITHROMAX) 250 MG tablet Take 1 tablet (250 mg total) by mouth daily. Take first 2 tablets together, then 1 every day until finished. 08/02/16   Long, Arlyss Repress, MD  benzonatate (TESSALON) 100 MG capsule  Take 1 capsule (100 mg total) by mouth every 8 (eight) hours. 08/02/16   Long, Arlyss Repress, MD  HYDROcodone-acetaminophen (NORCO/VICODIN) 5-325 MG tablet Take 1-2 tablets every 6 hours as needed for severe pain 03/09/19   Renne Crigler, PA-C  penicillin v potassium (VEETID) 500 MG tablet Take 1 tablet (500 mg total) by mouth 3 (three) times daily. 03/09/19   Renne Crigler, PA-C    Allergies    Pineapple  Review of Systems   Review of Systems  Constitutional: Negative for fever.  HENT: Positive for dental problem and facial swelling. Negative for ear pain, sore throat and trouble swallowing.   Respiratory: Negative for shortness of breath and stridor.   Musculoskeletal: Negative for neck pain.  Skin: Negative for color change.  Neurological: Negative for headaches.    Physical Exam Updated Vital Signs BP (!) 141/89   Pulse 95   Temp 99.2 F (37.3 C) (Oral)   Resp 16   Ht 5\' 5"  (1.651 m)   SpO2 100%   BMI 30.79 kg/m   Physical  Exam Vitals and nursing note reviewed.  Constitutional:      Appearance: She is well-developed.  HENT:     Head: Normocephalic and atraumatic.     Jaw: No trismus.     Right Ear: Tympanic membrane, ear canal and external ear normal.     Left Ear: Tympanic membrane, ear canal and external ear normal.     Nose: Nose normal.     Mouth/Throat:     Dentition: No dental abscesses.     Pharynx: Uvula midline. No uvula swelling.     Tonsils: No tonsillar abscesses.     Comments: There is swelling of the gumline lateral to and posterior to tooth #17 with associated pericoronitis.  Question small fluid collection. Eyes:     Conjunctiva/sclera: Conjunctivae normal.  Neck:     Comments: No neck swelling or Ludwig's angina Musculoskeletal:     Cervical back: Normal range of motion and neck supple.  Lymphadenopathy:     Cervical: No cervical adenopathy.  Skin:    General: Skin is warm and dry.  Neurological:     Mental Status: She is alert.     ED  Results / Procedures / Treatments   Labs (all labs ordered are listed, but only abnormal results are displayed) Labs Reviewed - No data to display  EKG None  Radiology No results found.  Procedures Procedures (including critical care time)  Medications Ordered in ED Medications  HYDROcodone-acetaminophen (NORCO/VICODIN) 5-325 MG per tablet 1 tablet (has no administration in time range)  penicillin v potassium (VEETID) tablet 500 mg (has no administration in time range)    ED Course  I have reviewed the triage vital signs and the nursing notes.  Pertinent labs & imaging results that were available during my care of the patient were reviewed by me and considered in my medical decision making (see chart for details).  6:36 PM Patient seen and examined. Medications ordered.   Vital signs reviewed and are as follows: BP (!) 141/89   Pulse 95   Temp 99.2 F (37.3 C) (Oral)   Resp 16   Ht 5\' 5"  (1.651 m)   SpO2 100%   BMI 30.79 kg/m   Patient counseled on use of narcotic pain medications. Counseled not to combine these medications with others containing tylenol. Urged not to drink alcohol, drive, or perform any other activities that requires focus while taking these medications. The patient verbalizes understanding and agrees with the plan.  Patient counseled to take prescribed medications as directed, return with worsening facial or neck swelling, and to follow-up with their dentist as soon as possible.      MDM Rules/Calculators/A&P                      Patient with, infection and possible small abscess bordering tooth #17.  Patient with associated pericoronitis.  Will treat with penicillin, have patient perform salt water rinses, and give limited supply of pain medication, #5 Vicodin 5-325 mg.  Encouraged dental follow-up.    Final Clinical Impression(s) / ED Diagnoses Final diagnoses:  Dental infection  Acute pericoronitis    Rx / DC Orders ED Discharge Orders           Ordered    HYDROcodone-acetaminophen (NORCO/VICODIN) 5-325 MG tablet     03/09/19 1828    penicillin v potassium (VEETID) 500 MG tablet  3 times daily     03/09/19 1828           Starlette Thurow,  Jinny Sanders 03/09/19 1837    Wyvonnia Dusky, MD 03/10/19 [phone]

## 2019-03-09 NOTE — Discharge Instructions (Signed)
Please read and follow all provided instructions.  Your diagnoses today include:  1. Dental infection   2. Acute pericoronitis     The exam and treatment you received today has been provided on an emergency basis only. This is not a substitute for complete medical or dental care.  Tests performed today include:  Vital signs. See below for your results today.   Medications prescribed:   Vicodin (hydrocodone/acetaminophen) - narcotic pain medication  DO NOT drive or perform any activities that require you to be awake and alert because this medicine can make you drowsy. BE VERY CAREFUL not to take multiple medicines containing Tylenol (also called acetaminophen). Doing so can lead to an overdose which can damage your liver and cause liver failure and possibly death.   Penicillin - antibiotic  You have been prescribed an antibiotic medicine: take the entire course of medicine even if you are feeling better. Stopping early can cause the antibiotic not to work.  Take any prescribed medications only as directed.  Home care instructions:  Follow any educational materials contained in this packet.  Follow-up instructions: Please follow-up with your dentist for further evaluation of your symptoms.   Return instructions:   Please return to the Emergency Department if you experience worsening symptoms.  Please return if you develop a fever, you develop more swelling in your face or neck, you have trouble breathing or swallowing food.  Please return if you have any other emergent concerns.  Additional Information:  Your vital signs today were: BP (!) 141/89   Pulse 95   Temp 99.2 F (37.3 C) (Oral)   Resp 16   Ht 5\' 5"  (1.651 m)   SpO2 100%   BMI 30.79 kg/m  If your blood pressure (BP) was elevated above 135/85 this visit, please have this repeated by your doctor within one month. --------------

## 2019-03-09 NOTE — ED Triage Notes (Signed)
Pt endorses dental abscess x1 week. Pain to left side of face.

## 2019-08-09 ENCOUNTER — Emergency Department (HOSPITAL_COMMUNITY)
Admission: EM | Admit: 2019-08-09 | Discharge: 2019-08-09 | Disposition: A | Discharge status: Home / Self Care | Payer: Medicaid Other | Attending: Emergency Medicine | Admitting: Emergency Medicine

## 2019-08-09 ENCOUNTER — Encounter (HOSPITAL_COMMUNITY): Payer: Self-pay | Admitting: Emergency Medicine

## 2019-08-09 ENCOUNTER — Other Ambulatory Visit: Payer: Self-pay

## 2019-08-09 DIAGNOSIS — K047 Periapical abscess without sinus: Secondary | ICD-10-CM

## 2019-08-09 DIAGNOSIS — F17290 Nicotine dependence, other tobacco product, uncomplicated: Secondary | ICD-10-CM | POA: Insufficient documentation

## 2019-08-09 LAB — POC URINE PREG, ED: Preg Test, Ur: NEGATIVE

## 2019-08-09 MED ORDER — CHLORHEXIDINE GLUCONATE 0.12 % MT SOLN: 15.0000 mL | Freq: Two times a day (BID) | OROMUCOSAL | 0 refills | Status: DC

## 2019-08-09 MED ORDER — IBUPROFEN 400 MG PO TABS
600.0000 mg | ORAL_TABLET | Freq: Once | ORAL | Status: AC
  Administered 2019-08-09: 18:00:00 600 mg via ORAL
  Filled 2019-08-09: qty 1

## 2019-08-09 MED ORDER — HYDROCODONE-ACETAMINOPHEN 5-325 MG PO TABS
1.0000 | ORAL_TABLET | Freq: Once | ORAL | Status: AC
  Administered 2019-08-09: 18:00:00 1 via ORAL
  Filled 2019-08-09: qty 1

## 2019-08-09 MED ORDER — AMOXICILLIN-POT CLAVULANATE 875-125 MG PO TABS: 1.0000 | ORAL_TABLET | Freq: Two times a day (BID) | ORAL | 0 refills | Status: AC

## 2019-08-09 NOTE — ED Triage Notes (Signed)
Pt. Stated, I have an abscess it bursted this morning and has filled back up I also need a pregnancy test

## 2019-08-09 NOTE — ED Provider Notes (Signed)
[facility] EMERGENCY DEPARTMENT Provider Note   CSN: [id number] Arrival date & time: 08/09/19  1326     History Chief Complaint  Patient presents with  . Dental Problem  . Abscess    Katherine Andrews is a 29 y.o. female.  HPI  Patient is a 29 year old female with no pertinent past medical history presented today with left upper dental pain.  She states that is been ongoing for approximately 4 days and has been worsening.  She states that this morning she felt a pop.  She states that the pain is achy, severe, worse with touch eating.  She states she has no difficulty opening closing her mouth swallowing or breathing.  She states she has no fevers or chills.  She states that it is painful and is over the area of an eroded tooth that she states has been bothering her.  Patient denies a dentist regularly.  She does not have established care with a dentist.  She is taking nothing for pain prior to arrival.  She states her pain is 10/10.     Past Medical History:  Diagnosis Date  . Anxiety   . BV (bacterial vaginosis)   . Chlamydia   . GERD (gastroesophageal reflux disease)   . Gonorrhea   . UTI (lower urinary tract infection)     Patient Active Problem List   Diagnosis Date Noted  . UTI (lower urinary tract infection)     Past Surgical History:  Procedure Laterality Date  . NO PAST SURGERIES       OB History    Gravida  0   Para      Term      Preterm      AB      Living        SAB      TAB      Ectopic      Multiple      Live Births              No family history on file.  Social History   Tobacco Use  . Smoking status: Current Every Day Smoker    Types: Cigars  . Smokeless tobacco: Never Used  Substance Use Topics  . Alcohol use: Yes  . Drug use: Yes    Frequency: 3.0 times per week    Types: Marijuana    Home Medications Prior to Admission medications   Medication Sig Start Date End Date Taking? Authorizing  Provider  albuterol (PROVENTIL HFA;VENTOLIN HFA) 108 (90 Base) MCG/ACT inhaler Inhale 1-2 puffs into the lungs every 6 (six) hours as needed for wheezing or shortness of breath. 08/02/16   Long, Arlyss Repress, MD  amoxicillin-clavulanate (AUGMENTIN) 875-125 MG tablet Take 1 tablet by mouth every 12 (twelve) hours for 7 days. 08/09/19 08/16/19  Gailen Shelter, PA  azithromycin (ZITHROMAX) 250 MG tablet Take 1 tablet (250 mg total) by mouth daily. Take first 2 tablets together, then 1 every day until finished. 08/02/16   Long, Arlyss Repress, MD  benzonatate (TESSALON) 100 MG capsule Take 1 capsule (100 mg total) by mouth every 8 (eight) hours. 08/02/16   Long, Arlyss Repress, MD  chlorhexidine (PERIDEX) 0.12 % solution Use as directed 15 mLs in the mouth or throat 2 (two) times daily. 08/09/19   Gailen Shelter, PA  HYDROcodone-acetaminophen (NORCO/VICODIN) 5-325 MG tablet Take 1-2 tablets every 6 hours as needed for severe pain 03/09/19   Renne Crigler, PA-C  penicillin v  potassium (VEETID) 500 MG tablet Take 1 tablet (500 mg total) by mouth 3 (three) times daily. 03/09/19   Renne Crigler, PA-C    Allergies    Pineapple  Review of Systems   Review of Systems  Constitutional: Negative for fever.  HENT: Positive for dental problem. Negative for congestion.   Respiratory: Negative for shortness of breath.   Cardiovascular: Negative for chest pain.  Gastrointestinal: Negative for abdominal distention.  Neurological: Negative for dizziness and headaches.    Physical Exam Updated Vital Signs BP 119/89 (BP Location: Right Arm)   Pulse 69   Temp 98.9 F (37.2 C) (Oral)   Resp 16   Ht 5\' 4"  (1.626 m)   Wt 97.5 kg   LMP 06/25/2019   SpO2 100%   BMI 36.90 kg/m   Physical Exam Vitals and nursing note reviewed.  Constitutional:      General: She is not in acute distress.    Appearance: Normal appearance. She is not ill-appearing.  HENT:     Head: Normocephalic and atraumatic.     Mouth/Throat:     Mouth:  Mucous membranes are moist.     Pharynx: Oropharynx is clear.      Comments: Periapical abscess over the gumline indicated and picture.  No trismus.  Full range of motion of tongue.  No tenderness palpation underneath the tongue. Eyes:     General: No scleral icterus.       Right eye: No discharge.        Left eye: No discharge.     Conjunctiva/sclera: Conjunctivae normal.  Pulmonary:     Effort: Pulmonary effort is normal.     Breath sounds: No stridor.  Musculoskeletal:     Cervical back: Neck supple.  Lymphadenopathy:     Cervical: No cervical adenopathy.  Neurological:     Mental Status: She is alert and oriented to person, place, and time. Mental status is at baseline.     ED Results / Procedures / Treatments   Labs (all labs ordered are listed, but only abnormal results are displayed) Labs Reviewed  POC URINE PREG, ED    EKG None  Radiology No results found.  Procedures Procedures (including critical care time)  Medications Ordered in ED Medications  HYDROcodone-acetaminophen (NORCO/VICODIN) 5-325 MG per tablet 1 tablet (1 tablet Oral Given 08/09/19 1751)  ibuprofen (ADVIL) tablet 600 mg (600 mg Oral Given 08/09/19 1751)    ED Course  I have reviewed the triage vital signs and the nursing notes.  Pertinent labs & imaging results that were available during my care of the patient were reviewed by me and considered in my medical decision making (see chart for details).    MDM Rules/Calculators/A&P                          Patient here for 4 days of left upper dental pain.  States that it "burst "today and that pain is somewhat improved but states that it is still severely painful.  Physical exam patient has periapical abscess of the left upper gumline with eroded tooth next to it.  She has not followed by dentistry.  I provide patient with Hibiclens, Augmentin antibiotics, dentistry referral and return precautions.  No evidence of Ludwig's or deep space  infection.  Area is already draining somewhat there is no indication for further incision and drainage.  Patient also given some topical lidocaine lollipops.  Pregnancy test ordered in triage because patient  was concern for pregnancy.  It is negative.  Final Clinical Impression(s) / ED Diagnoses Final diagnoses:  Periapical abscess    Rx / DC Orders ED Discharge Orders         Ordered    chlorhexidine (PERIDEX) 0.12 % solution  2 times daily     Discontinue  Reprint     08/09/19 1806    amoxicillin-clavulanate (AUGMENTIN) 875-125 MG tablet  Every 12 hours     Discontinue  Reprint     08/09/19 1806           Gailen Shelter, [place] 08/09/19 1811    Geoffery Lyons, MD 08/09/19 Ernestina Columbia

## 2019-08-09 NOTE — Discharge Instructions (Addendum)
You have a periapical abscess.  Please take antibiotics as prescribed you, use Tylenol and ibuprofen for pain, call the dentist office tomorrow morning and tell them that you were referred by the emergency department for a periapical abscess with a rotten tooth.  Please rinse with Hibiclens which I provided a prescription for.  Please use Tylenol or ibuprofen for pain.  You may use 600 mg ibuprofen every 6 hours or 1000 mg of Tylenol every 6 hours.  You may choose to alternate between the 2.  This would be most effective.  Not to exceed 4 g of Tylenol within 24 hours.  Not to exceed 3200 mg ibuprofen 24 hours.

## 2019-12-27 ENCOUNTER — Encounter (HOSPITAL_COMMUNITY): Payer: Self-pay | Admitting: Emergency Medicine

## 2019-12-27 ENCOUNTER — Emergency Department (HOSPITAL_COMMUNITY): Payer: Medicaid Other

## 2019-12-27 ENCOUNTER — Emergency Department (HOSPITAL_COMMUNITY)
Admission: EM | Admit: 2019-12-27 | Discharge: 2019-12-27 | Disposition: A | Discharge status: Home / Self Care | Payer: Medicaid Other | Attending: Emergency Medicine | Admitting: Emergency Medicine

## 2019-12-27 ENCOUNTER — Other Ambulatory Visit: Payer: Self-pay

## 2019-12-27 DIAGNOSIS — R52 Pain, unspecified: Secondary | ICD-10-CM

## 2019-12-27 DIAGNOSIS — W228XXA Striking against or struck by other objects, initial encounter: Secondary | ICD-10-CM | POA: Insufficient documentation

## 2019-12-27 DIAGNOSIS — S99921A Unspecified injury of right foot, initial encounter: Secondary | ICD-10-CM

## 2019-12-27 DIAGNOSIS — F17290 Nicotine dependence, other tobacco product, uncomplicated: Secondary | ICD-10-CM | POA: Insufficient documentation

## 2019-12-27 LAB — POC URINE PREG, ED: Preg Test, Ur: NEGATIVE

## 2019-12-27 NOTE — Discharge Instructions (Signed)
You were seen today with foot pain. Your x-ray does not show a fracture. Use the crutches as needed for pain. You can walk on the foot with hard sole shoes. Follow up with your PCP and Podiatry as needed if your toe continues to be painful.
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
## 2019-12-27 NOTE — ED Provider Notes (Signed)
Emergency Department Provider Note   I have reviewed the triage vital signs and the nursing notes.   HISTORY  Chief Complaint Toe Injury   HPI Katherine Andrews is a 29 y.o. female with PMH reviewed below presents to the ED with right toe pain after stubbing it two days prior. Pain with walking on the foot. No bleeding. No numbness. Pain worse with walking. No other injuries. No radiation of symptoms or modifying factors.    Past Medical History:  Diagnosis Date  . Anxiety   . BV (bacterial vaginosis)   . Chlamydia   . GERD (gastroesophageal reflux disease)   . Gonorrhea   . UTI (lower urinary tract infection)     Patient Active Problem List   Diagnosis Date Noted  . UTI (lower urinary tract infection)     Past Surgical History:  Procedure Laterality Date  . NO PAST SURGERIES      Allergies Pineapple  No family history on file.  Social History Social History   Tobacco Use  . Smoking status: Current Every Day Smoker    Types: Cigars  . Smokeless tobacco: Never Used  Substance Use Topics  . Alcohol use: Yes  . Drug use: Yes    Frequency: 3.0 times per week    Types: Marijuana    Review of Systems  Constitutional: No fever/chills Musculoskeletal: Right toe pain.  Skin: Negative for rash. Neurological: Negative for numbness.  ____________________________________________   PHYSICAL EXAM:  VITAL SIGNS: ED Triage Vitals  Enc Vitals Group     BP 12/27/19 1434 127/78     Pulse Rate 12/27/19 1434 77     Resp 12/27/19 1434 14     Temp 12/27/19 1434 98.6 F (37 C)     Temp Source 12/27/19 1434 Oral     SpO2 12/27/19 1434 96 %     Weight --      Height 12/27/19 1434 5\' 4"  (1.626 m)   Constitutional: Alert and oriented. Well appearing and in no acute distress. Eyes: Conjunctivae are normal.  Head: Atraumatic. Nose: No congestion/rhinnorhea. Mouth/Throat: Mucous membranes are moist.  Neck: No stridor.   Cardiovascular: Good peripheral  circulation.   Respiratory: Normal respiratory effort.  Gastrointestinal: No distention.  Musculoskeletal: No subungual hematoma. No deformity. No visible FB.  Neurologic:  Normal speech and language.  Skin:  Skin is warm, dry and intact. No rash noted.  ____________________________________________   LABS (all labs ordered are listed, but only abnormal results are displayed)  Labs Reviewed  POC URINE PREG, ED   ____________________________________________  RADIOLOGY  DG Toe Great Right  Result Date: 12/27/2019 CLINICAL DATA:  First toe pain following blunt trauma, initial encounter EXAM: RIGHT GREAT TOE COMPARISON:  None. FINDINGS: There is no evidence of fracture or dislocation. There is no evidence of arthropathy or other focal bone abnormality. Tiny radiopaque density is noted along the medial aspect of the first MTP joint. This is of uncertain chronicity but likely represents a small foreign body. IMPRESSION: No acute fracture noted. Small foreign body in the medial soft tissues as described. This is of uncertain chronicity. Electronically Signed   By: 14/05/2019 M.D.   On: 12/27/2019 15:10    ____________________________________________   PROCEDURES  Procedure(s) performed:   Procedures  None  ____________________________________________   INITIAL IMPRESSION / ASSESSMENT AND PLAN / ED COURSE  Pertinent labs & imaging results that were available during my care of the patient were reviewed by me and considered in my medical  decision making (see chart for details).   Patient presents to the ED with toe pain after injury. No FB visualized on exam as seen on x-ray. Favor chronic findings. Crutches provided for comfort and Podiatry number provided.    ____________________________________________  FINAL CLINICAL IMPRESSION(S) / ED DIAGNOSES  Final diagnoses:  Injury of toe on right foot, initial encounter    Note:  This document was prepared using Dragon voice  recognition software and may include unintentional dictation errors.  Alona Bene, MD, [facility] Emergency Medicine    Kayliegh Boyers, Arlyss Repress, MD 12/27/19 [phone]

## 2019-12-27 NOTE — ED Notes (Signed)
Discharged at 1800-- pain 2/10, crutch walking without difficulty

## 2019-12-27 NOTE — Progress Notes (Signed)
Orthopedic Tech Progress Note Patient Details:  Katherine Andrews 1990/04/22 [id number]  Ortho Devices Type of Ortho Device: Crutches Ortho Device/Splint Interventions: Adjustment, Ordered   Post Interventions Patient Tolerated: Well Instructions Provided: Adjustment of device, Care of device, Poper ambulation with device   Nidonna P Harle Stanford 12/27/2019, 4:45 PM

## 2020-10-25 ENCOUNTER — Other Ambulatory Visit: Payer: Self-pay

## 2020-10-25 ENCOUNTER — Ambulatory Visit (HOSPITAL_COMMUNITY)
Admission: EM | Admit: 2020-10-25 | Discharge: 2020-10-25 | Disposition: A | Discharge status: Home / Self Care | Payer: Medicaid Other | Attending: Family Medicine | Admitting: Family Medicine

## 2020-10-25 ENCOUNTER — Encounter (HOSPITAL_COMMUNITY): Payer: Self-pay

## 2020-10-25 DIAGNOSIS — G510 Bell's palsy: Secondary | ICD-10-CM

## 2020-10-25 MED ORDER — PREDNISONE 10 MG (48) PO TBPK: ORAL_TABLET | ORAL | 0 refills | Status: DC
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
## 2020-10-25 NOTE — ED Triage Notes (Signed)
SXS discussed with Dr. Tracie Harrier.

## 2020-10-26 NOTE — ED Provider Notes (Signed)
[facility]   [id number] 10/25/20 Arrival Time: 1829  ASSESSMENT & PLAN:  1. Bell's palsy    See AVS for discharge information. No indication for neurodiagnostic imaging at this time. Discussed.  Begin: Meds ordered this encounter  Medications   predniSONE (STERAPRED UNI-PAK 48 TAB) 10 MG (48) TBPK tablet    Sig: Take as directed.    Dispense:  48 tablet    Refill:  0    Follow-up Information     [facility].   Specialty: Urgent Care Why: If worsening or failing to improve as anticipated. Contact information: [address] [place] [address] [phone]               Discussed importance of eye care and lubricating eye gtts.  Reviewed expectations re: course of current medical issues. Questions answered. Outlined signs and symptoms indicating need for more acute intervention. Patient verbalized understanding. After Visit Summary given.   SUBJECTIVE:  Katherine Andrews is a 30 y.o. female who reports noting L-sided facial droop upon waking this morning. No trauma. Otherwise well. Distant facial trauma which she was told "injured a facial nerve". No h/o facial droop. No recent illnesses. No tx PTA. No extremity sensation changes or weakness. Without HA or vision changes. Normal ambulation.  Social History   Substance and Sexual Activity  Alcohol Use Yes   Social History   Tobacco Use  Smoking Status Every Day   Types: Cigars  Smokeless Tobacco Never   Denies recreational drug use.   OBJECTIVE:  Vitals:   10/25/20 1917 10/25/20 1918  BP:  (!) 117/91  Pulse: 99   Resp: 19   Temp: 98.2 F (36.8 C)   TempSrc: Oral   SpO2: 100%     General appearance: alert; no distress Eyes: PERRLA; EOMI; conjunctiva normal HENT: normocephalic; atraumatic Neck: supple with FROM Heart: regular Abdomen: soft, non-tender; bowel sounds normal Extremities: no cyanosis or edema; symmetrical with no gross  deformities Skin: warm and dry Neurologic: normal gait; DTR's normal and symmetric; obvious Bell's palsy of L side of face Psychological: alert and cooperative; normal mood and affect   Allergies  Allergen Reactions   Pineapple Swelling    Tongue swelling    Past Medical History:  Diagnosis Date   Anxiety    BV (bacterial vaginosis)    Chlamydia    GERD (gastroesophageal reflux disease)    Gonorrhea    UTI (lower urinary tract infection)    Social History   Socioeconomic History   Marital status: Single    Spouse name: Not on file   Number of children: Not on file   Years of education: Not on file   Highest education level: Not on file  Occupational History   Not on file  Tobacco Use   Smoking status: Every Day    Types: Cigars   Smokeless tobacco: Never  Substance and Sexual Activity   Alcohol use: Yes   Drug use: Yes    Frequency: 3.0 times per week    Types: Marijuana   Sexual activity: Not on file  Other Topics Concern   Not on file  Social History Narrative   Not on file   Social Determinants of Health   Financial Resource Strain: Not on file  Food Insecurity: Not on file  Transportation Needs: Not on file  Physical Activity: Not on file  Stress: Not on file  Social Connections: Not on file  Intimate Partner Violence: Not  on file   History reviewed. No pertinent family history. Past Surgical History:  Procedure Laterality Date   NO PAST SURGERIES         Mardella Layman, MD 10/26/20 [phone]

## 2021-03-06 ENCOUNTER — Encounter (HOSPITAL_COMMUNITY): Payer: Self-pay

## 2021-03-06 ENCOUNTER — Other Ambulatory Visit: Payer: Self-pay

## 2021-03-06 ENCOUNTER — Ambulatory Visit (HOSPITAL_COMMUNITY)
Admission: EM | Admit: 2021-03-06 | Discharge: 2021-03-06 | Disposition: A | Discharge status: Home / Self Care | Payer: Medicaid Other | Attending: Emergency Medicine | Admitting: Emergency Medicine

## 2021-03-06 DIAGNOSIS — N898 Other specified noninflammatory disorders of vagina: Secondary | ICD-10-CM | POA: Insufficient documentation

## 2021-03-06 DIAGNOSIS — R1032 Left lower quadrant pain: Secondary | ICD-10-CM | POA: Insufficient documentation

## 2021-03-06 DIAGNOSIS — R1031 Right lower quadrant pain: Secondary | ICD-10-CM | POA: Insufficient documentation

## 2021-03-06 DIAGNOSIS — Z7251 High risk heterosexual behavior: Secondary | ICD-10-CM | POA: Insufficient documentation

## 2021-03-06 DIAGNOSIS — R102 Pelvic and perineal pain: Secondary | ICD-10-CM | POA: Insufficient documentation

## 2021-03-06 DIAGNOSIS — Z8742 Personal history of other diseases of the female genital tract: Secondary | ICD-10-CM | POA: Insufficient documentation

## 2021-03-06 LAB — POCT URINALYSIS DIPSTICK, ED / UC
Bilirubin Urine: NEGATIVE
Glucose, UA: NEGATIVE mg/dL
Ketones, ur: NEGATIVE mg/dL
Leukocytes,Ua: NEGATIVE
Nitrite: NEGATIVE
Protein, ur: NEGATIVE mg/dL
Specific Gravity, Urine: 1.025 (ref 1.005–1.030)
Urobilinogen, UA: 0.2 mg/dL (ref 0.0–1.0)
pH: 5 (ref 5.0–8.0)

## 2021-03-06 LAB — POC URINE PREG, ED: Preg Test, Ur: NEGATIVE

## 2021-03-06 MED ORDER — CEFTRIAXONE SODIUM 500 MG IJ SOLR
INTRAMUSCULAR | Status: AC
  Filled 2021-03-06: qty 500

## 2021-03-06 MED ORDER — CEFTRIAXONE SODIUM 500 MG IJ SOLR
500.0000 mg | Freq: Once | INTRAMUSCULAR | Status: AC
  Administered 2021-03-06: 500 mg via INTRAMUSCULAR

## 2021-03-06 MED ORDER — LIDOCAINE HCL (PF) 1 % IJ SOLN
INTRAMUSCULAR | Status: AC
  Filled 2021-03-06: qty 2

## 2021-03-06 NOTE — ED Provider Notes (Signed)
[facility]       CSN: [id number] Arrival date & time: 03/06/21  4259   HISTORY        Chief Complaint  Patient presents with   Abdominal Pain      Onset abdomen pain x 2-3 weeks.     HPI Katherine Andrews is a 31 y.o. female. Patient presents to urgent care today complaining of a 2 to 3-week history of abdominal pain in her lower abdomen.  Patient states a few years ago she had pelvic inflammatory disease, states this feels the same.  Patient states her lower abdomen is throbbing and feels swollen.  Patient states she is also having a thick white vaginal discharge but denies vaginal pruritus.  Patient states about a week ago she had a urinary tract infection was given antibiotics for this and feels much better.  Patient states she no longer has any burning with urination, increased frequency of urination or increased urge to urinate.  Patient states she is sexually active with more than 1 partner.  Patient denies genital lesion.  Patient denies fever, aches, chills, nausea, vomiting, diarrhea.  Patient endorses pain radiating to groin on both sides.   The history is provided by the patient.      Past Medical History:  Diagnosis Date   Anxiety     BV (bacterial vaginosis)     Chlamydia     GERD (gastroesophageal reflux disease)     Gonorrhea     UTI (lower urinary tract infection)          Patient Active Problem List    Diagnosis Date Noted   UTI (lower urinary tract infection)           Past Surgical History:  Procedure Laterality Date   NO PAST SURGERIES        OB History       Gravida  0   Para      Term      Preterm      AB      Living           SAB      IAB      Ectopic      Multiple      Live Births                Home Medications                       Prior to Admission medications   Medication Sig Start Date End Date Taking? Authorizing Provider  albuterol (PROVENTIL HFA;VENTOLIN HFA) 108 (90 Base) MCG/ACT inhaler Inhale  1-2 puffs into the lungs every 6 (six) hours as needed for wheezing or shortness of breath. 08/02/16     Long, Arlyss Repress, MD  azithromycin (ZITHROMAX) 250 MG tablet Take 1 tablet (250 mg total) by mouth daily. Take first 2 tablets together, then 1 every day until finished. 08/02/16     Long, Arlyss Repress, MD  benzonatate (TESSALON) 100 MG capsule Take 1 capsule (100 mg total) by mouth every 8 (eight) hours. 08/02/16     Long, Arlyss Repress, MD  chlorhexidine (PERIDEX) 0.12 % solution Use as directed 15 mLs in the mouth or throat 2 (two) times daily. 08/09/19     Gailen Shelter, PA  HYDROcodone-acetaminophen (NORCO/VICODIN) 5-325 MG tablet Take 1-2 tablets every 6 hours as needed for severe pain 03/09/19     Renne Crigler, PA-C  penicillin v  potassium (VEETID) 500 MG tablet Take 1 tablet (500 mg total) by mouth 3 (three) times daily. 03/09/19     Renne CriglerGeiple, Joshua, PA-C  predniSONE (STERAPRED UNI-PAK 48 TAB) 10 MG (48) TBPK tablet Take as directed. 10/25/20     Mardella LaymanHagler, Brian, MD    Family History History reviewed. No pertinent family history. Social History Social History         Tobacco Use   Smoking status: Every Day      Types: Cigars   Smokeless tobacco: Never  Substance Use Topics   Alcohol use: Yes   Drug use: Yes      Frequency: 3.0 times per week      Types: Marijuana    Allergies              Pineapple   Review of Systems Review of Systems Pertinent findings noted in history of present illness.    Physical Exam Triage Vital Signs     ED Triage Vitals  Enc Vitals Group     BP 11/18/20 0827 (!) 147/82     Pulse Rate 11/18/20 0827 72     Resp 11/18/20 0827 18     Temp 11/18/20 0827 98.3 F (36.8 C)     Temp Source 11/18/20 0827 Oral     SpO2 11/18/20 0827 98 %     Weight --       Height --       Head Circumference --       Peak Flow --       Pain Score 11/18/20 0826 5     Pain Loc --       Pain Edu? --       Excl. in GC? --    No data found.   Updated Vital Signs BP (!)  135/94 (BP Location: Left Arm)    Pulse 93    Temp 98.3 F (36.8 C)    Resp 16    SpO2 96%    Physical Exam Vitals and nursing note reviewed.  Constitutional:      General: She is not in acute distress.    Appearance: Normal appearance. She is not ill-appearing.  HENT:     Head: Normocephalic and atraumatic.  Eyes:     General: Lids are normal.        Right eye: No discharge.        Left eye: No discharge.     Extraocular Movements: Extraocular movements intact.     Conjunctiva/sclera: Conjunctivae normal.     Right eye: Right conjunctiva is not injected.     Left eye: Left conjunctiva is not injected.  Neck:     Trachea: Trachea and phonation normal.  Cardiovascular:     Rate and Rhythm: Normal rate and regular rhythm.     Pulses: Normal pulses.     Heart sounds: Normal heart sounds. No murmur heard.   No friction rub. No gallop.  Pulmonary:     Effort: Pulmonary effort is normal. No accessory muscle usage, prolonged expiration or respiratory distress.     Breath sounds: Normal breath sounds. No stridor, decreased air movement or transmitted upper airway sounds. No decreased breath sounds, wheezing, rhonchi or rales.  Chest:     Chest wall: No tenderness.  Abdominal:     General: Abdomen is flat. Bowel sounds are normal. There is no distension.     Palpations: Abdomen is soft.     Tenderness: There is abdominal tenderness in the right  lower quadrant, suprapubic area and left lower quadrant. There is no right CVA tenderness or left CVA tenderness.     Hernia: No hernia is present.  Genitourinary:    Comments: Patient politely declines pelvic exam today, patient provided a vaginal swab for testing. Musculoskeletal:        General: Normal range of motion.     Cervical back: Normal range of motion and neck supple. Normal range of motion.  Lymphadenopathy:     Cervical: No cervical adenopathy.  Skin:    General: Skin is warm and dry.     Findings: No erythema or rash.   Neurological:     General: No focal deficit present.     Mental Status: She is alert and oriented to person, place, and time.  Psychiatric:        Mood and Affect: Mood normal.        Behavior: Behavior normal.      Visual Acuity Right Eye Distance:      Left Eye Distance:         Bilateral Distance:           Right Eye Near:            Left Eye Near:               Bilateral Near:                 UC Couse / Diagnostics / Procedures:  EKG   Radiology Imaging Results (Last 48 hours)  No results found.     Procedures Procedures (including critical care time)   UC Diagnoses / Final Clinical Impressions(s)   I have reviewed the triage vital signs and the nursing notes.   Pertinent labs & imaging results that were available during my care of the patient were reviewed by me and considered in my medical decision making (see chart for details).     Final diagnoses:  Risk for sexually transmitted disease  White vaginal discharge  Pelvic pain in female  Bilateral groin pain  History of pelvic inflammatory disease    Patient was provided with Ceftriaxone 500 mg IM for empiric treatment of presumed GC based on the history provided to me today.   Patient was advised to abstain from sexual intercourse for the next 7 days while being treated.  Patient was also advised to use condoms to protect themselves from STD exposure.   STD screening was performed, patient advised that the results be posted to their MyChart and if any of the results are positive, they will be notified by phone, further treatment will be provided as indicated based on results of STD screening.   Return precautions advised.  Drug allergies reviewed, all questions addressed.    Urine dip today was positive for blood, urine cx will be performed, treat for UTI as neeed.  .  Urine pregnancy test was negative.   ED Prescriptions   None     PDMP not reviewed this encounter.   Pending results:  Labs Reviewed   POCT URINALYSIS DIP (MANUAL ENTRY)  POCT URINE PREGNANCY  CERVICOVAGINAL ANCILLARY ONLY      Medications Ordered in UC: Medications  cefTRIAXone (ROCEPHIN) injection 500 mg (has no administration in time range)      Disposition Upon Discharge:  Condition: stable for discharge home   Patient presented with concern for an acute illness with associated systemic symptoms and significant discomfort requiring urgent management. In my opinion, this is a condition that  a prudent lay person (someone who possesses an average knowledge of health and medicine) may potentially expect to result in complications if not addressed urgently such as respiratory distress, impairment of bodily function or dysfunction of bodily organs.    As such, the patient has been evaluated and assessed, work-up was performed and treatment was provided in alignment with urgent care protocols and evidence based medicine.  Patient/parent/caregiver has been advised that the patient may require follow up for further testing and/or treatment if the symptoms continue in spite of treatment, as clinically indicated and appropriate.   Routine symptom specific, illness specific and/or disease specific instructions were discussed with the patient and/or caregiver at length.  Prevention strategies for avoiding STD exposure were also discussed.   The patient will follow up with their current PCP if and as advised. If the patient does not currently have a PCP we will assist them in obtaining one.    The patient may need specialty follow up if the symptoms continue, in spite of conservative treatment and management, for further workup, evaluation, consultation and treatment as clinically indicated and appropriate.  Patient/parent/caregiver verbalized understanding and agreement of plan as discussed.  All questions were addressed during visit.  Please see discharge instructions below for further details of plan.   Discharge  Instructions:     Discharge Instructions        Based on the history you provided to me today, you were treated empirically for gonorrhea with an injection of ceftriaxone 500 mg.  This is the only treatment you will need for gonorrhea.  Please abstain from sexual intercourse for 7 days.   The results of your STD testing today will be made available to you once they are complete, this typically takes 3 to 5 days.  They will initially be posted to your MyChart and, if any of your results are abnormal, you will receive a phone call with those results along with further instructions regarding any further treatment, if needed.    Please remember that the only way to prevent transmission of sexually transmitted disease when having sexual intercourse is to use condoms.  Repeat sexually transmitted infections can cause scarring in your fallopian tubes which will interfere with your ability to conceive later in life.  Repeat exposures to sexually transmitted diseases can also increase your risk of human papilloma virus which causes cervical cancer and genital warts.   Please remember that the only way to prevent transmission of sexually transmitted disease when having sexual intercourse is to use condoms.  Repeat sexually transmitted infections can cause scarring of the tubes that carry sperm from your testicles to your penis during ejaculation.  This can interfere with your your ability to have children.  Repeat exposures to sexually transmitted diseases can also increase your risk of human papilloma virus which causes genital warts.   If you have not had complete resolution of your symptoms after completing treatment, please return for repeat evaluation.   Thank you for visiting urgent care today.  I appreciate the opportunity to participate in your care.         This office note has been dictated using Teaching laboratory technician.  Unfortunately, and despite my best efforts, this method of  dictation can sometimes lead to occasional typographical or grammatical errors.  I apologize in advance if this occurs.             Theadora Rama Scales, [place] 03/06/21 [phone]

## 2021-03-06 NOTE — ED Provider Notes (Incomplete)
[facility]    CSN: [id number] Arrival date & time: 03/06/21  [id number]    HISTORY   Chief Complaint  Patient presents with   Abdominal Pain    Onset abdomen pain x 2-3 weeks.    HPI Katherine Andrews is a 31 y.o. female. Patient presents to urgent care today complaining of a 2 to 3-week history of abdominal pain in her lower abdomen.  Patient states a few years ago she had pelvic inflammatory disease, states this feels the same.  Patient states her lower abdomen is throbbing and feels swollen.  Patient states she is also having a thick white vaginal discharge but denies vaginal pruritus.  Patient states about a week ago she had a urinary tract infection was given antibiotics for this and feels much better.  Patient states she no longer has any burning with urination, increased frequency of urination or increased urge to urinate.  Patient states she is sexually active with more than 1 partner.  Patient denies genital lesion.  Patient denies fever, aches, chills, nausea, vomiting, diarrhea.  Patient endorses pain radiating to groin on both sides.  The history is provided by the patient.  Past Medical History:  Diagnosis Date   Anxiety    BV (bacterial vaginosis)    Chlamydia    GERD (gastroesophageal reflux disease)    Gonorrhea    UTI (lower urinary tract infection)    Patient Active Problem List   Diagnosis Date Noted   UTI (lower urinary tract infection)    Past Surgical History:  Procedure Laterality Date   NO PAST SURGERIES     OB History     Gravida  0   Para      Term      Preterm      AB      Living         SAB      IAB      Ectopic      Multiple      Live Births             Home Medications    Prior to Admission medications   Medication Sig Start Date End Date Taking? Authorizing Provider  albuterol (PROVENTIL HFA;VENTOLIN HFA) 108 (90 Base) MCG/ACT inhaler Inhale 1-2 puffs into the lungs every 6 (six) hours as needed for  wheezing or shortness of breath. 08/02/16   Long, Arlyss RepressJoshua G, MD  azithromycin (ZITHROMAX) 250 MG tablet Take 1 tablet (250 mg total) by mouth daily. Take first 2 tablets together, then 1 every day until finished. 08/02/16   Long, Arlyss RepressJoshua G, MD  benzonatate (TESSALON) 100 MG capsule Take 1 capsule (100 mg total) by mouth every 8 (eight) hours. 08/02/16   Long, Arlyss RepressJoshua G, MD  chlorhexidine (PERIDEX) 0.12 % solution Use as directed 15 mLs in the mouth or throat 2 (two) times daily. 08/09/19   Gailen ShelterFondaw, Wylder S, PA  HYDROcodone-acetaminophen (NORCO/VICODIN) 5-325 MG tablet Take 1-2 tablets every 6 hours as needed for severe pain 03/09/19   Renne CriglerGeiple, Joshua, PA-C  penicillin v potassium (VEETID) 500 MG tablet Take 1 tablet (500 mg total) by mouth 3 (three) times daily. 03/09/19   Renne CriglerGeiple, Joshua, PA-C  predniSONE (STERAPRED UNI-PAK 48 TAB) 10 MG (48) TBPK tablet Take as directed. 10/25/20   Mardella LaymanHagler, Brian, MD   Family History History reviewed. No pertinent family history. Social History Social History   Tobacco Use   Smoking status: Every Day    Types: Cigars  Smokeless tobacco: Never  Substance Use Topics   Alcohol use: Yes   Drug use: Yes    Frequency: 3.0 times per week    Types: Marijuana   Allergies   Pineapple  Review of Systems Review of Systems Pertinent findings noted in history of present illness.   Physical Exam Triage Vital Signs ED Triage Vitals  Enc Vitals Group     BP 11/18/20 0827 (!) 147/82     Pulse Rate 11/18/20 0827 72     Resp 11/18/20 0827 18     Temp 11/18/20 0827 98.3 F (36.8 C)     Temp Source 11/18/20 0827 Oral     SpO2 11/18/20 0827 98 %     Weight --      Height --      Head Circumference --      Peak Flow --      Pain Score 11/18/20 0826 5     Pain Loc --      Pain Edu? --      Excl. in GC? --   No data found.  Updated Vital Signs BP (!) 135/94 (BP Location: Left Arm)    Pulse 93    Temp 98.3 F (36.8 C)    Resp 16    SpO2 96%   Physical  Exam Vitals and nursing note reviewed.  Constitutional:      General: She is not in acute distress.    Appearance: Normal appearance. She is not ill-appearing.  HENT:     Head: Normocephalic and atraumatic.  Eyes:     General: Lids are normal.        Right eye: No discharge.        Left eye: No discharge.     Extraocular Movements: Extraocular movements intact.     Conjunctiva/sclera: Conjunctivae normal.     Right eye: Right conjunctiva is not injected.     Left eye: Left conjunctiva is not injected.  Neck:     Trachea: Trachea and phonation normal.  Cardiovascular:     Rate and Rhythm: Normal rate and regular rhythm.     Pulses: Normal pulses.     Heart sounds: Normal heart sounds. No murmur heard.   No friction rub. No gallop.  Pulmonary:     Effort: Pulmonary effort is normal. No accessory muscle usage, prolonged expiration or respiratory distress.     Breath sounds: Normal breath sounds. No stridor, decreased air movement or transmitted upper airway sounds. No decreased breath sounds, wheezing, rhonchi or rales.  Chest:     Chest wall: No tenderness.  Abdominal:     General: Abdomen is flat. Bowel sounds are normal. There is no distension.     Palpations: Abdomen is soft.     Tenderness: There is abdominal tenderness in the right lower quadrant, suprapubic area and left lower quadrant. There is no right CVA tenderness or left CVA tenderness.     Hernia: No hernia is present.  Genitourinary:    Comments: Patient politely declines pelvic exam today, patient provided a vaginal swab for testing. Musculoskeletal:        General: Normal range of motion.     Cervical back: Normal range of motion and neck supple. Normal range of motion.  Lymphadenopathy:     Cervical: No cervical adenopathy.  Skin:    General: Skin is warm and dry.     Findings: No erythema or rash.  Neurological:     General: No focal deficit present.  Mental Status: She is alert and oriented to person,  place, and time.  Psychiatric:        Mood and Affect: Mood normal.        Behavior: Behavior normal.    Visual Acuity Right Eye Distance:   Left Eye Distance:   Bilateral Distance:    Right Eye Near:   Left Eye Near:    Bilateral Near:     UC Couse / Diagnostics / Procedures:    EKG  Radiology No results found.  Procedures Procedures (including critical care time)  UC Diagnoses / Final Clinical Impressions(s)   I have reviewed the triage vital signs and the nursing notes.  Pertinent labs & imaging results that were available during my care of the patient were reviewed by me and considered in my medical decision making (see chart for details).    Final diagnoses:  Risk for sexually transmitted disease  White vaginal discharge  Pelvic pain in female  Bilateral groin pain  History of pelvic inflammatory disease   Patient was provided with Ceftriaxone 500 mg IM for empiric treatment of presumed GC based on the history provided to me today.   Patient was advised to abstain from sexual intercourse for the next 7 days while being treated.  Patient was also advised to use condoms to protect themselves from STD exposure.   STD screening was performed, patient advised that the results be posted to their MyChart and if any of the results are positive, they will be notified by phone, further treatment will be provided as indicated based on results of STD screening.   Return precautions advised.  Drug allergies reviewed, all questions addressed.   Urine dip today was positive for blood, urine cx will be performed, treat for UTI as neeed.  .  Urine pregnancy test was negative.  ED Prescriptions   None    PDMP not reviewed this encounter.  Pending results:  Labs Reviewed  POCT URINALYSIS DIP (MANUAL ENTRY)  POCT URINE PREGNANCY  CERVICOVAGINAL ANCILLARY ONLY    Medications Ordered in UC: Medications  cefTRIAXone (ROCEPHIN) injection 500 mg (has no administration in  time range)    Disposition Upon Discharge:  Condition: stable for discharge home  Patient presented with concern for an acute illness with associated systemic symptoms and significant discomfort requiring urgent management. In my opinion, this is a condition that a prudent lay person (someone who possesses an average knowledge of health and medicine) may potentially expect to result in complications if not addressed urgently such as respiratory distress, impairment of bodily function or dysfunction of bodily organs.   As such, the patient has been evaluated and assessed, work-up was performed and treatment was provided in alignment with urgent care protocols and evidence based medicine.  Patient/parent/caregiver has been advised that the patient may require follow up for further testing and/or treatment if the symptoms continue in spite of treatment, as clinically indicated and appropriate.  Routine symptom specific, illness specific and/or disease specific instructions were discussed with the patient and/or caregiver at length.  Prevention strategies for avoiding STD exposure were also discussed.  The patient will follow up with their current PCP if and as advised. If the patient does not currently have a PCP we will assist them in obtaining one.   The patient may need specialty follow up if the symptoms continue, in spite of conservative treatment and management, for further workup, evaluation, consultation and treatment as clinically indicated and appropriate.  Patient/parent/caregiver verbalized understanding and  agreement of plan as discussed.  All questions were addressed during visit.  Please see discharge instructions below for further details of plan.  Discharge Instructions:   Discharge Instructions      Based on the history you provided to me today, you were treated empirically for gonorrhea with an injection of ceftriaxone 500 mg.  This is the only treatment you will need for  gonorrhea.  Please abstain from sexual intercourse for 7 days.   The results of your STD testing today will be made available to you once they are complete, this typically takes 3 to 5 days.  They will initially be posted to your MyChart and, if any of your results are abnormal, you will receive a phone call with those results along with further instructions regarding any further treatment, if needed.    Please remember that the only way to prevent transmission of sexually transmitted disease when having sexual intercourse is to use condoms.  Repeat sexually transmitted infections can cause scarring in your fallopian tubes which will interfere with your ability to conceive later in life.  Repeat exposures to sexually transmitted diseases can also increase your risk of human papilloma virus which causes cervical cancer and genital warts.   Please remember that the only way to prevent transmission of sexually transmitted disease when having sexual intercourse is to use condoms.  Repeat sexually transmitted infections can cause scarring of the tubes that carry sperm from your testicles to your penis during ejaculation.  This can interfere with your your ability to have children.  Repeat exposures to sexually transmitted diseases can also increase your risk of human papilloma virus which causes genital warts.   If you have not had complete resolution of your symptoms after completing treatment, please return for repeat evaluation.   Thank you for visiting urgent care today.  I appreciate the opportunity to participate in your care.     This office note has been dictated using Teaching laboratory technician.  Unfortunately, and despite my best efforts, this method of dictation can sometimes lead to occasional typographical or grammatical errors.  I apologize in advance if this occurs.

## 2021-03-06 NOTE — Discharge Instructions (Addendum)
Based on the history you provided to me today, you were treated empirically for gonorrhea with an injection of ceftriaxone 500 mg.  This is the only treatment you will need for gonorrhea.  Please abstain from sexual intercourse for 7 days.   The results of your STD testing today will be made available to you once they are complete, this typically takes 3 to 5 days.  They will initially be posted to your MyChart and, if any of your results are abnormal, you will receive a phone call with those results along with further instructions regarding any further treatment, if needed.    Please remember that the only way to prevent transmission of sexually transmitted disease when having sexual intercourse is to use condoms.  Repeat sexually transmitted infections can cause scarring in your fallopian tubes which will interfere with your ability to conceive later in life.  Repeat exposures to sexually transmitted diseases can also increase your risk of human papilloma virus which causes cervical cancer and genital warts.   Please remember that the only way to prevent transmission of sexually transmitted disease when having sexual intercourse is to use condoms.  Repeat sexually transmitted infections can cause scarring of the tubes that carry sperm from your testicles to your penis during ejaculation.  This can interfere with your your ability to have children.  Repeat exposures to sexually transmitted diseases can also increase your risk of human papilloma virus which causes genital warts.  The urinalysis that we performed in the clinic today was abnormal.  Urine culture will be performed per our protocol.  The result of the urine culture will be available in the next 3 to 5 days and will be posted to your MyChart account.  If there is an abnormal finding, you will be contacted by phone and advised of further treatment recommendations, if any.    If you have not had complete resolution of your symptoms after  completing treatment, please return for repeat evaluation.   Thank you for visiting urgent care today.  I appreciate the opportunity to participate in your care.

## 2021-03-06 NOTE — ED Triage Notes (Signed)
Pt presents for abdomen pain for 2-3 weeks. She report having PID in the past. She is having discharge.

## 2021-03-07 LAB — CERVICOVAGINAL ANCILLARY ONLY
Bacterial Vaginitis (gardnerella): POSITIVE — AB
Candida Glabrata: NEGATIVE
Candida Vaginitis: NEGATIVE
Chlamydia: POSITIVE — AB
Comment: NEGATIVE
Comment: NEGATIVE
Comment: NEGATIVE
Comment: NEGATIVE
Comment: NEGATIVE
Comment: NORMAL
Neisseria Gonorrhea: NEGATIVE
Trichomonas: NEGATIVE

## 2021-03-08 ENCOUNTER — Telehealth (HOSPITAL_COMMUNITY): Payer: Self-pay | Admitting: Emergency Medicine

## 2021-03-08 LAB — URINE CULTURE: Culture: 20000 — AB

## 2021-03-08 MED ORDER — DOXYCYCLINE HYCLATE 100 MG PO CAPS: 100.0000 mg | ORAL_CAPSULE | Freq: Two times a day (BID) | ORAL | 0 refills | Status: AC

## 2021-03-08 MED ORDER — CEPHALEXIN 500 MG PO CAPS: 500.0000 mg | ORAL_CAPSULE | Freq: Two times a day (BID) | ORAL | 0 refills | Status: AC

## 2021-03-08 MED ORDER — METRONIDAZOLE 500 MG PO TABS: 500.0000 mg | ORAL_TABLET | Freq: Two times a day (BID) | ORAL | 0 refills | Status: DC

## 2021-06-04 ENCOUNTER — Encounter (HOSPITAL_COMMUNITY): Payer: Self-pay | Admitting: Emergency Medicine

## 2021-06-04 ENCOUNTER — Ambulatory Visit (HOSPITAL_COMMUNITY)
Admission: EM | Admit: 2021-06-04 | Discharge: 2021-06-04 | Disposition: A | Discharge status: Home / Self Care | Payer: Self-pay

## 2021-06-04 DIAGNOSIS — R509 Fever, unspecified: Secondary | ICD-10-CM

## 2021-06-04 DIAGNOSIS — J029 Acute pharyngitis, unspecified: Secondary | ICD-10-CM

## 2021-06-04 DIAGNOSIS — J020 Streptococcal pharyngitis: Secondary | ICD-10-CM

## 2021-06-04 DIAGNOSIS — J069 Acute upper respiratory infection, unspecified: Secondary | ICD-10-CM

## 2021-06-04 LAB — POCT RAPID STREP A, ED / UC: Streptococcus, Group A Screen (Direct): POSITIVE — AB

## 2021-06-04 MED ORDER — AMOXICILLIN 500 MG PO CAPS: 500.0000 mg | ORAL_CAPSULE | Freq: Three times a day (TID) | ORAL | 0 refills | Status: DC

## 2021-06-04 NOTE — ED Provider Notes (Signed)
?[facility] ? ? ? ?CSN: [id number] ?Arrival date & time: 06/04/21  1324 ? ? ?  ? ?History   ?Chief Complaint ?Chief Complaint  ?Patient presents with  ? Sore Throat  ? ? ?HPI ?Katherine Andrews is a 31 y.o. female.  ? ?31 year old female with sore throat.  Patient relates for the past 2 to 3 days she has had progressive sore throat, painful swallowing.  Patient relates she has had some mild sinus congestion, postnasal drip, rhinitis with clear production patient also indicates she has had some mild bilateral ear congestion.  Patient has had mild cough without production.  Patient relates that over the past 24 hours she has had some mild fever but without chills or sweats.  Patient has not been around anyone that she knows with strep throat.  But she does relate that her mother looked at her throat and said that it was red and had white patches in the back of her throat.  No nausea or vomiting. ? ? ?Sore Throat ? ? ?Past Medical History:  ?Diagnosis Date  ? Anxiety   ? BV (bacterial vaginosis)   ? Chlamydia   ? GERD (gastroesophageal reflux disease)   ? Gonorrhea   ? UTI (lower urinary tract infection)   ? ? ?Patient Active Problem List  ? Diagnosis Date Noted  ? UTI (lower urinary tract infection)   ? ? ?Past Surgical History:  ?Procedure Laterality Date  ? NO PAST SURGERIES    ? ? ?OB History   ? ? Gravida  ?0  ? Para  ?   ? Term  ?   ? Preterm  ?   ? AB  ?   ? Living  ?   ?  ? ? SAB  ?   ? IAB  ?   ? Ectopic  ?   ? Multiple  ?   ? Live Births  ?   ?   ?  ?  ? ? ? ?Home Medications   ? ?Prior to Admission medications   ?Medication Sig Start Date End Date Taking? Authorizing Provider  ?amoxicillin (AMOXIL) 500 MG capsule Take 1 capsule (500 mg total) by mouth 3 (three) times daily. 06/04/21  Yes Ellsworth Lennox, PA-C  ?citalopram (CELEXA) 20 MG tablet Take 20 mg by mouth daily. 07/29/18  Yes [provider]  ?albuterol (PROVENTIL HFA;VENTOLIN HFA) 108 (90 Base) MCG/ACT inhaler Inhale 1-2 puffs into the  lungs every 6 (six) hours as needed for wheezing or shortness of breath. 08/02/16   Long, Arlyss Repress, MD  ?benzonatate (TESSALON) 100 MG capsule Take 1 capsule (100 mg total) by mouth every 8 (eight) hours. 08/02/16   Long, Arlyss Repress, MD  ?chlorhexidine (PERIDEX) 0.12 % solution Use as directed 15 mLs in the mouth or throat 2 (two) times daily. 08/09/19   Gailen Shelter, PA  ?HYDROcodone-acetaminophen (NORCO/VICODIN) 5-325 MG tablet Take 1-2 tablets every 6 hours as needed for severe pain 03/09/19   Renne Crigler, PA-C  ?metroNIDAZOLE (FLAGYL) 500 MG tablet Take 1 tablet (500 mg total) by mouth 2 (two) times daily. 03/08/21   LampteyBritta Mccreedy, MD  ?predniSONE (STERAPRED UNI-PAK 48 TAB) 10 MG (48) TBPK tablet Take as directed. 10/25/20   Mardella Layman, MD  ? ? ?Family History ?No family history on file. ? ?Social History ?Social History  ? ?Tobacco Use  ? Smoking status: Every Day  ?  Types: Cigars  ? Smokeless tobacco: Never  ?Substance Use Topics  ?  Alcohol use: Yes  ? Drug use: Yes  ?  Frequency: 3.0 times per week  ?  Types: Marijuana  ? ? ? ?Allergies   ?Pineapple ? ? ?Review of Systems ?Review of Systems  ?HENT:  Positive for rhinorrhea and sore throat.   ?Respiratory:  Positive for cough.   ? ? ?Physical Exam ?Triage Vital Signs ?ED Triage Vitals  ?Enc Vitals Group  ?   BP 06/04/21 1346 (!) 129/92  ?   Pulse Rate 06/04/21 1346 93  ?   Resp 06/04/21 1346 17  ?   Temp 06/04/21 1346 99.4 ?F (37.4 ?C)  ?   Temp Source 06/04/21 1346 Oral  ?   SpO2 06/04/21 1346 98 %  ?   Weight --   ?   Height --   ?   Head Circumference --   ?   Peak Flow --   ?   Pain Score 06/04/21 1352 9  ?   Pain Loc --   ?   Pain Edu? --   ?   Excl. in [place]? --   ? ?No data found. ? ?Updated Vital Signs ?BP (!) 129/92 (BP Location: Left Arm)   Pulse 93   Temp 99.4 ?F (37.4 ?C) (Oral)   Resp 17   LMP 05/21/2021   SpO2 98%  ? ?Visual Acuity ?Right Eye Distance:   ?Left Eye Distance:   ?Bilateral Distance:   ? ?Right Eye Near:   ?Left Eye Near:     ?Bilateral Near:    ? ?Physical Exam ?Constitutional:   ?   Appearance: She is well-developed.  ?HENT:  ?   Right Ear: Tympanic membrane is injected.  ?   Left Ear: Tympanic membrane is injected.  ?   Mouth/Throat:  ?   Comments: Mouth: Pharynx has redness bilateral tonsillar areas with mild exudate present. ?Neck:  ?   Comments: Neck: Mild lymphadenopathy on the right side. ?Cardiovascular:  ?   Rate and Rhythm: Normal rate and regular rhythm.  ?Pulmonary:  ?   Comments: Lungs: Normal breath sounds without rales, rhonchi, or wheezes bilaterally. ?Neurological:  ?   Mental Status: She is alert.  ? ? ?UC Treatments / Results  ?Labs ?(all labs ordered are listed, but only abnormal results are displayed) ?Labs Reviewed  ?POCT RAPID STREP A, ED / UC - Abnormal; Notable for the following components:  ?    Result Value  ? Streptococcus, Group A Screen (Direct) POSITIVE (*)   ? All other components within normal limits  ? ?EKG ? ? ?Radiology ?No results found. ? ?Procedures ?Procedures (including critical care time) ? ?Medications Ordered in UC ?Medications - No data to display ? ?Initial Impression / Assessment and Plan / UC Course  ?I have reviewed the triage vital signs and the nursing notes. ? ?Pertinent labs & imaging results that were available during my care of the patient were reviewed by me and considered in my medical decision making (see chart for details). ? ?  ?Plan: ?Advised salt water gargles 3-4 times a day for sore throat. ?Advised ibuprofen for pain relief. ?Take the amoxicillin 500 mg 1 every 8 hours as directed. ?Work up with PCP if needed. ?Final Clinical Impressions(s) / UC Diagnoses  ? ?Final diagnoses:  ?Pharyngitis, unspecified etiology  ?Acute upper respiratory infection  ?Fever, unspecified  ?Strep throat  ?Streptococcal sore throat  ? ? ? ?Discharge Instructions   ? ?  ?Advised salt water gargles 3-4 times a day. ?Ibuprofen  as needed for pain relief. ? ? ? ?ED Prescriptions   ? ? Medication Sig  Dispense Auth. Provider  ? amoxicillin (AMOXIL) 500 MG capsule Take 1 capsule (500 mg total) by mouth 3 (three) times daily. 21 capsule Ellsworth Lennox, PA-C  ? ?  ? ?PDMP not reviewed this encounter. ?  ?Ellsworth Lennox, PA-C ?06/04/21 1418 ? ?

## 2021-06-04 NOTE — Discharge Instructions (Signed)
Advised salt water gargles 3-4 times a day. ?Ibuprofen as needed for pain relief. ?

## 2021-06-04 NOTE — ED Notes (Signed)
Patient states throat and tongue feel "swollen" x3 days with pain, painful swallowing, white spots on back of throat.  9/10 pain ?

## 2021-06-04 NOTE — ED Triage Notes (Signed)
Pain in throat with some tongue and throat swelling x 3 days. Noticed white spots in back of throat.  ?

## 2022-01-26 ENCOUNTER — Encounter (HOSPITAL_COMMUNITY): Payer: Self-pay | Admitting: Emergency Medicine

## 2022-01-26 ENCOUNTER — Ambulatory Visit (HOSPITAL_COMMUNITY)
Admission: EM | Admit: 2022-01-26 | Discharge: 2022-01-26 | Disposition: A | Discharge status: Home / Self Care | Payer: Medicaid Other | Attending: Internal Medicine | Admitting: Internal Medicine

## 2022-01-26 DIAGNOSIS — S61452A Open bite of left hand, initial encounter: Secondary | ICD-10-CM | POA: Diagnosis not present

## 2022-01-26 DIAGNOSIS — Z23 Encounter for immunization: Secondary | ICD-10-CM | POA: Diagnosis not present

## 2022-01-26 DIAGNOSIS — W540XXA Bitten by dog, initial encounter: Secondary | ICD-10-CM

## 2022-01-26 DIAGNOSIS — Z792 Long term (current) use of antibiotics: Secondary | ICD-10-CM

## 2022-01-26 MED ORDER — TETANUS-DIPHTH-ACELL PERTUSSIS 5-2.5-18.5 LF-MCG/0.5 IM SUSY
0.5000 mL | PREFILLED_SYRINGE | Freq: Once | INTRAMUSCULAR | Status: AC
  Administered 2022-01-26: 0.5 mL via INTRAMUSCULAR

## 2022-01-26 MED ORDER — TETANUS-DIPHTH-ACELL PERTUSSIS 5-2.5-18.5 LF-MCG/0.5 IM SUSY
PREFILLED_SYRINGE | INTRAMUSCULAR | Status: AC
  Filled 2022-01-26: qty 0.5

## 2022-01-26 MED ORDER — AMOXICILLIN-POT CLAVULANATE 875-125 MG PO TABS: 1.0000 | ORAL_TABLET | Freq: Two times a day (BID) | ORAL | 0 refills | Status: AC

## 2022-01-26 NOTE — Discharge Instructions (Signed)
Take Augmentin antibiotic twice daily for the next 7 days for prophylactic treatment for infection to the dog bite.   We updated your tetanus shot in the clinic today.  If your dog displays any aggressive behavior or any concerning behavior, please bring it to the vet for evaluation.  If this happens, it is wise for you to come back to urgent care for the rabies immunization series.  At this time, you are low risk.  If you change your mind, you may always return to urgent care and start the series.   Come back to urgent care if you experience any signs of infection to your wound despite antibiotic use such as redness, swelling, pus from the site, or severe pain.   If you develop any new or worsening symptoms or do not improve in the next 2 to 3 days, please return.  If your symptoms are severe, please go to the emergency room.  Follow-up with your primary care provider for further evaluation and management of your symptoms as well as ongoing wellness visits.  I hope you feel better!

## 2022-01-26 NOTE — ED Provider Notes (Signed)
[place]    CSN: [id number] Arrival date & time: 01/26/22  1713      History   Chief Complaint Chief Complaint  Patient presents with  . Animal Bite    HPI Katherine Andrews is a 32 y.o. female.   [place] dog bite out of the blue Dog is kind of a feisty down to begin with Unknown last date of tetanus shot No bleeding to the wounds Wounds are very painful No recent antibiotics Dog is never received rabies vaccine Agreeable to 10-day waiting period     Past Medical History:  Diagnosis Date  . Anxiety   . BV (bacterial vaginosis)   . Chlamydia   . GERD (gastroesophageal reflux disease)   . Gonorrhea   . UTI (lower urinary tract infection)     Patient Active Problem List   Diagnosis Date Noted  . UTI (lower urinary tract infection)     Past Surgical History:  Procedure Laterality Date  . NO PAST SURGERIES      OB History     Gravida  0   Para      Term      Preterm      AB      Living         SAB      IAB      Ectopic      Multiple      Live Births               Home Medications    Prior to Admission medications   Medication Sig Start Date End Date Taking? Authorizing Provider  albuterol (PROVENTIL HFA;VENTOLIN HFA) 108 (90 Base) MCG/ACT inhaler Inhale 1-2 puffs into the lungs every 6 (six) hours as needed for wheezing or shortness of breath. 08/02/16   Long, Wonda Olds, MD  amoxicillin (AMOXIL) 500 MG capsule Take 1 capsule (500 mg total) by mouth 3 (three) times daily. 06/04/21   Nyoka Lint, PA-C  benzonatate (TESSALON) 100 MG capsule Take 1 capsule (100 mg total) by mouth every 8 (eight) hours. 08/02/16   Long, Wonda Olds, MD  chlorhexidine (PERIDEX) 0.12 % solution Use as directed 15 mLs in the mouth or throat 2 (two) times daily. 08/09/19   Tedd Sias, PA  citalopram (CELEXA) 20 MG tablet Take 20 mg by mouth daily. 07/29/18   [provider]  HYDROcodone-acetaminophen (NORCO/VICODIN) 5-325 MG  tablet Take 1-2 tablets every 6 hours as needed for severe pain 03/09/19   Carlisle Cater, PA-C  metroNIDAZOLE (FLAGYL) 500 MG tablet Take 1 tablet (500 mg total) by mouth 2 (two) times daily. 03/08/21   Lamptey, Myrene Galas, MD  predniSONE (STERAPRED UNI-PAK 48 TAB) 10 MG (48) TBPK tablet Take as directed. 10/25/20   Vanessa Kick, MD    Family History No family history on file.  Social History Social History   Tobacco Use  . Smoking status: Every Day    Types: Cigars  . Smokeless tobacco: Never  Substance Use Topics  . Alcohol use: Yes  . Drug use: Yes    Frequency: 3.0 times per week    Types: Marijuana     Allergies   Pineapple   Review of Systems Review of Systems   Physical Exam Triage Vital Signs ED Triage Vitals  Enc Vitals Group     BP 01/26/22 1911 130/88     Pulse Rate 01/26/22 1911 79     Resp 01/26/22 1911 17  Temp 01/26/22 1911 98.6 F (37 C)     Temp Source 01/26/22 1911 Oral     SpO2 01/26/22 1911 98 %     Weight --      Height --      Head Circumference --      Peak Flow --      Pain Score 01/26/22 1909 9     Pain Loc --      Pain Edu? --      Excl. in [place]? --    No data found.  Updated Vital Signs BP 130/88 (BP Location: Right Arm)   Pulse 79   Temp 98.6 F (37 C) (Oral)   Resp 17   LMP 12/23/2021   SpO2 98%   Visual Acuity Right Eye Distance:   Left Eye Distance:   Bilateral Distance:    Right Eye Near:   Left Eye Near:    Bilateral Near:     Physical Exam   UC Treatments / Results  Labs (all labs ordered are listed, but only abnormal results are displayed) Labs Reviewed - No data to display  EKG   Radiology No results found.  Procedures Procedures (including critical care time)  Medications Ordered in UC Medications  Tdap (BOOSTRIX) injection 0.5 mL (has no administration in time range)    Initial Impression / Assessment and Plan / UC Course  I have reviewed the triage vital signs and the nursing  notes.  Pertinent labs & imaging results that were available during my care of the patient were reviewed by me and considered in my medical decision making (see chart for details).     *** Final Clinical Impressions(s) / UC Diagnoses   Final diagnoses:  None   Discharge Instructions   None    ED Prescriptions   None    PDMP not reviewed this encounter.

## 2022-01-26 NOTE — ED Triage Notes (Signed)
Pt reports her dog bit her left hand this morning. Has scratches on several places on left hand. Pt reports that dog shots not up to date but no exposure or concern for rabies. Denies taking medications for pain.

## 2022-11-19 ENCOUNTER — Encounter (HOSPITAL_COMMUNITY): Payer: Self-pay | Admitting: Emergency Medicine

## 2022-11-19 ENCOUNTER — Emergency Department (HOSPITAL_COMMUNITY)
Admission: EM | Admit: 2022-11-19 | Discharge: 2022-11-19 | Disposition: A | Discharge status: Home / Self Care | Payer: Medicaid Other | Attending: Emergency Medicine | Admitting: Emergency Medicine

## 2022-11-19 ENCOUNTER — Other Ambulatory Visit: Payer: Self-pay

## 2022-11-19 ENCOUNTER — Emergency Department (HOSPITAL_COMMUNITY): Payer: Medicaid Other

## 2022-11-19 DIAGNOSIS — M25562 Pain in left knee: Secondary | ICD-10-CM | POA: Insufficient documentation

## 2022-11-19 DIAGNOSIS — Y9301 Activity, walking, marching and hiking: Secondary | ICD-10-CM | POA: Insufficient documentation

## 2022-11-19 DIAGNOSIS — M1712 Unilateral primary osteoarthritis, left knee: Secondary | ICD-10-CM | POA: Diagnosis not present

## 2022-11-19 DIAGNOSIS — M25462 Effusion, left knee: Secondary | ICD-10-CM | POA: Diagnosis not present

## 2022-11-19 DIAGNOSIS — X509XXA Other and unspecified overexertion or strenuous movements or postures, initial encounter: Secondary | ICD-10-CM | POA: Diagnosis not present

## 2022-11-19 MED ORDER — ACETAMINOPHEN 500 MG PO TABS
1000.0000 mg | ORAL_TABLET | Freq: Once | ORAL | Status: AC
  Administered 2022-11-19: 1000 mg via ORAL
  Filled 2022-11-19: qty 2

## 2022-11-19 MED ORDER — MORPHINE SULFATE 15 MG PO TABS: 7.5000 mg | ORAL_TABLET | ORAL | 0 refills | Status: DC | PRN

## 2022-11-19 MED ORDER — OXYCODONE HCL 5 MG PO TABS
5.0000 mg | ORAL_TABLET | Freq: Once | ORAL | Status: AC
  Administered 2022-11-19: 5 mg via ORAL
  Filled 2022-11-19: qty 1

## 2022-11-19 MED ORDER — KETOROLAC TROMETHAMINE 15 MG/ML IJ SOLN
15.0000 mg | Freq: Once | INTRAMUSCULAR | Status: AC
Start: 2022-11-19 — End: 2022-11-19
  Administered 2022-11-19: 15 mg via INTRAMUSCULAR
  Filled 2022-11-19: qty 1

## 2022-11-19 NOTE — Discharge Instructions (Addendum)
Try to keep your weight off of the leg as best you can and elevate it when you are not up and moving around.  Follow-up with orthopedics in the office.  Everyone needs a family doctor.  Please try to establish with 1 so they can keep track of you said that you are happy and healthy many years from now.  Take 4 over the counter ibuprofen tablets 3 times a day or 2 over-the-counter naproxen tablets twice a day for pain. Also take tylenol 1000mg (2 extra strength) four times a day.   Then take the pain medicine if you feel like you need it. Narcotics do not help with the pain, they only make you care about it less.  You can become addicted to this, people may break into your house to steal it.  It will constipate you.  If you drive under the influence of this medicine you can get a DUI.

## 2022-11-19 NOTE — Progress Notes (Signed)
Orthopedic Tech Progress Note Patient Details:  Katherine Andrews [birth year] [id number]  Ortho Devices Type of Ortho Device: Crutches, Knee Immobilizer Ortho Device/Splint Location: lle Ortho Device/Splint Interventions: Ordered, Application, Adjustment   Post Interventions Patient Tolerated: Well Instructions Provided: Care of device, Adjustment of device  [name] Post 11/19/2022, 9:22 PM
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
## 2022-11-19 NOTE — ED Notes (Signed)
Ortho tech at bedside 

## 2022-11-19 NOTE — ED Notes (Signed)
Ortho tech paged  
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
## 2022-11-27 DIAGNOSIS — M25562 Pain in left knee: Secondary | ICD-10-CM | POA: Diagnosis not present

## 2022-12-25 DIAGNOSIS — M25562 Pain in left knee: Secondary | ICD-10-CM | POA: Diagnosis not present

## 2022-12-26 NOTE — Therapy (Unsigned)
OUTPATIENT PHYSICAL THERAPY LOWER EXTREMITY EVALUATION   Patient Name: Katherine Andrews MRN: [id number] DOB:11/07/1990, 32 y.o., female Today's Date: 12/26/2022  END OF SESSION:   Past Medical History:  Diagnosis Date   Anxiety    BV (bacterial vaginosis)    Chlamydia    GERD (gastroesophageal reflux disease)    Gonorrhea    UTI (lower urinary tract infection)    Past Surgical History:  Procedure Laterality Date   NO PAST SURGERIES     Patient Active Problem List   Diagnosis Date Noted   Lower urinary tract infectious disease     PCP: Patient, No Pcp Per   REFERRING PROVIDER: Margart Sickles, PA-C  REFERRING DIAG: LEFT KNEE PAIN  THERAPY DIAG:  No diagnosis found.  Rationale for Evaluation and Treatment: Rehabilitation  ONSET DATE: 11/19/22  SUBJECTIVE:   SUBJECTIVE STATEMENT: ***  PERTINENT HISTORY: 32 yo F with a cc of left knee pain.  Patient had her knee buckled while she was walking.  Plain film of the knee independently interpreted by me without fracture.  Unable to assess the ligaments or meniscus due to discomfort.  Knee immobilizer crutches orthopedic follow-up.   8:44 PM:  I have discussed the diagnosis/risks/treatment options with the patient.  Evaluation and diagnostic testing in the emergency department does not suggest an emergent condition requiring admission or immediate intervention beyond what has been performed at this time.  They will follow up with PCP. We also discussed returning to the ED immediately if new or worsening sx occur. We discussed the sx which are most concerning (e.g., sudden worsening pain, fever, inability to tolerate by mouth) that necessitate immediate return. Medications administered to the patient during their visit and any new prescriptions provided to the patient are listed below. PAIN:  Are you having pain? {OPRCPAIN:27236}  PRECAUTIONS: None  RED FLAGS: None   WEIGHT BEARING RESTRICTIONS: No  FALLS:  Has  patient fallen in last 6 months? Yes. Number of falls 1  OCCUPATION: ***  PLOF: Independent  PATIENT GOALS: ***  NEXT MD VISIT: ***  OBJECTIVE:  Note: Objective measures were completed at Evaluation unless otherwise noted.  DIAGNOSTIC FINDINGS: 32 yo F with a cc of left knee pain.  Patient had her knee buckled while she was walking.  Plain film of the knee independently interpreted by me without fracture.  Unable to assess the ligaments or meniscus due to discomfort.  Knee immobilizer crutches orthopedic follow-up.   8:44 PM:  I have discussed the diagnosis/risks/treatment options with the patient.  Evaluation and diagnostic testing in the emergency department does not suggest an emergent condition requiring admission or immediate intervention beyond what has been performed at this time.  They will follow up with PCP. We also discussed returning to the ED immediately if new or worsening sx occur. We discussed the sx which are most concerning (e.g., sudden worsening pain, fever, inability to tolerate by mouth) that necessitate immediate return. Medications administered to the patient during their visit and any new prescriptions provided to the patient are listed below.  PATIENT SURVEYS:  FOTO ***  EDEMA:  {edema:24020}  MUSCLE LENGTH: Hamstrings: Right *** deg; Left *** deg Maisie Fus test: Right *** deg; Left *** deg  POSTURE: {posture:25561}  PALPATION: ***  LOWER EXTREMITY ROM:  {AROM/PROM:27142} ROM Right eval Left eval  Hip flexion    Hip extension    Hip abduction    Hip adduction    Hip internal rotation    Hip external rotation  Knee flexion    Knee extension    Ankle dorsiflexion    Ankle plantarflexion    Ankle inversion    Ankle eversion     (Blank rows = not tested)  LOWER EXTREMITY MMT:  MMT Right eval Left eval  Hip flexion    Hip extension    Hip abduction    Hip adduction    Hip internal rotation    Hip external rotation    Knee flexion     Knee extension    Ankle dorsiflexion    Ankle plantarflexion    Ankle inversion    Ankle eversion     (Blank rows = not tested)  LOWER EXTREMITY SPECIAL TESTS:  {LEspecialtests:26242}  FUNCTIONAL TESTS:  30 seconds chair stand test  GAIT: Distance walked: 54ftx2 Assistive device utilized: None Level of assistance: Complete Independence Comments: ***   TODAY'S TREATMENT:                                                                                                                              DATE: ***    PATIENT EDUCATION:  Education details: Discussed eval findings, rehab rationale and POC and patient is in agreement  Person educated: Patient Education method: Explanation Education comprehension: verbalized understanding and needs further education  HOME EXERCISE PROGRAM: ***  ASSESSMENT:  CLINICAL IMPRESSION: Patient is a 32 y.o. female who was seen today for physical therapy evaluation and treatment for ***.   OBJECTIVE IMPAIRMENTS: {opptimpairments:25111}.   ACTIVITY LIMITATIONS: {activitylimitations:27494}  PERSONAL FACTORS: {Personal factors:25162} are also affecting patient's functional outcome.   REHAB POTENTIAL: Good  CLINICAL DECISION MAKING: Evolving/moderate complexity  EVALUATION COMPLEXITY: Low   GOALS: Goals reviewed with patient? No  SHORT TERM GOALS: Target date: *** *** Baseline: Goal status: INITIAL  2.  *** Baseline:  Goal status: INITIAL  3.  *** Baseline:  Goal status: INITIAL  4.  *** Baseline:  Goal status: INITIAL  5.  *** Baseline:  Goal status: INITIAL  6.  *** Baseline:  Goal status: INITIAL  LONG TERM GOALS: Target date: ***  *** Baseline:  Goal status: INITIAL  2.  *** Baseline:  Goal status: INITIAL  3.  *** Baseline:  Goal status: INITIAL  4.  *** Baseline:  Goal status: INITIAL  5.  *** Baseline:  Goal status: INITIAL  6.  *** Baseline:  Goal status: INITIAL   PLAN:  PT  FREQUENCY: {rehab frequency:25116}  PT DURATION: {rehab duration:25117}  PLANNED INTERVENTIONS: {rehab planned interventions:25118::"97110-Therapeutic exercises","97530- Therapeutic [phone]- Neuromuscular re-education","97535- Self MVHQ","46962- Manual therapy"}  PLAN FOR NEXT SESSION: ***   Hildred Laser, PT 12/26/2022, 1:11 PM

## 2022-12-27 ENCOUNTER — Ambulatory Visit: Payer: Medicaid Other

## 2023-01-12 NOTE — H&P (Signed)
PREOPERATIVE H&P  Chief Complaint: left knee medial and lateral meniscus tear, ACL tear  HPI: Katherine Andrews is a 32 y.o. female who is scheduled for, Procedure(s): KNEE ARTHROSCOPY WITH LATERAL MENISECTOMY KNEE ARTHROSCOPY WITH MEDIAL MENISECTOMY KNEE ARTHROSCOPY WITH ANTERIOR CRUCIATE LIGAMENT (ACL) RECONSTRUCTION.   Patient has a past medical history significant for GERD, anxiety.   She states she had fallen out the front door of her house.  She has had continued pain since this time.  Upon further discussion she did mention a previous history of a knee injury several years ago that was treated with a brace.  She states she does not recall getting any advanced imaging with it.  Felt it never really completely healed  Symptoms are rated as moderate to severe, and have been worsening.  This is significantly impairing activities of daily living.    Please see clinic note for further details on this patient's care.    She has elected for surgical management.   Past Medical History:  Diagnosis Date   Anxiety    BV (bacterial vaginosis)    Chlamydia    GERD (gastroesophageal reflux disease)    Gonorrhea    UTI (lower urinary tract infection)    Past Surgical History:  Procedure Laterality Date   NO PAST SURGERIES     Social History   Socioeconomic History   Marital status: Single    Spouse name: Not on file   Number of children: Not on file   Years of education: Not on file   Highest education level: Not on file  Occupational History   Not on file  Tobacco Use   Smoking status: Every Day    Types: Cigars   Smokeless tobacco: Never  Substance and Sexual Activity   Alcohol use: Yes   Drug use: Yes    Frequency: 3.0 times per week    Types: Marijuana   Sexual activity: Not on file  Other Topics Concern   Not on file  Social History Narrative   Not on file   Social Drivers of Health   Financial Resource Strain: Not on file  Food Insecurity: Not on file   Transportation Needs: Not on file  Physical Activity: Not on file  Stress: Not on file  Social Connections: Not on file   No family history on file. Allergies  Allergen Reactions   Pineapple Swelling    Tongue swelling   Prior to Admission medications   Medication Sig Start Date End Date Taking? Authorizing Provider  albuterol (PROVENTIL HFA;VENTOLIN HFA) 108 (90 Base) MCG/ACT inhaler Inhale 1-2 puffs into the lungs every 6 (six) hours as needed for wheezing or shortness of breath. 08/02/16   Long, Arlyss Repress, MD  amoxicillin-clavulanate (AUGMENTIN) 875-125 MG tablet Take 1 tablet by mouth every 12 (twelve) hours. 01/26/22   Carlisle Beers, FNP  benzonatate (TESSALON) 100 MG capsule Take 1 capsule (100 mg total) by mouth every 8 (eight) hours. 08/02/16   Long, Arlyss Repress, MD  chlorhexidine (PERIDEX) 0.12 % solution Use as directed 15 mLs in the mouth or throat 2 (two) times daily. 08/09/19   Gailen Shelter, PA  citalopram (CELEXA) 20 MG tablet Take 20 mg by mouth daily. 07/29/18   [provider]  HYDROcodone-acetaminophen (NORCO/VICODIN) 5-325 MG tablet Take 1-2 tablets every 6 hours as needed for severe pain 03/09/19   Renne Crigler, PA-C  morphine (MSIR) 15 MG tablet Take 0.5 tablets (7.5 mg total) by mouth every 4 (  four) hours as needed. 11/19/22   Melene Plan, DO  predniSONE (STERAPRED UNI-PAK 48 TAB) 10 MG (48) TBPK tablet Take as directed. 10/25/20   Mardella Layman, MD    ROS: All other systems have been reviewed and were otherwise negative with the exception of those mentioned in the HPI and as above.  Physical Exam: General: Alert, no acute distress Cardiovascular: No pedal edema Respiratory: No cyanosis, no use of accessory musculature GI: No organomegaly, abdomen is soft and non-tender Skin: No lesions in the area of chief complaint Neurologic: Sensation intact distally Psychiatric: Patient is competent for consent with normal mood and affect Lymphatic: No axillary  or cervical lymphadenopathy  MUSCULOSKELETAL:  Left knee- She has medial and lateral joint line tenderness with the left knee to palpation.  Positive anterior drawer and positive Lachman.  There is mild swelling.  No significant effusion.  No warmth or erythema.  Stable to varus and valgus.    Imaging: MRI shows high grade partial to full thickness tear of the ACL There is acute pivot shift marrow contusion pattern.  Mildly complex tear of the posterior horn medial meniscus.  Oblique undersurface tear of the posterior horn of the lateral meniscus and thin full thickness fissure within the medial patella facet  Assessment: left knee medial and lateral meniscus tear, ACL tear  Plan: Plan for Procedure(s): KNEE ARTHROSCOPY WITH LATERAL MENISECTOMY KNEE ARTHROSCOPY WITH MEDIAL MENISECTOMY KNEE ARTHROSCOPY WITH ANTERIOR CRUCIATE LIGAMENT (ACL) RECONSTRUCTION    The risks benefits and alternatives were discussed with the patient including but not limited to the risks of nonoperative treatment, versus surgical intervention including infection, bleeding, nerve injury,  blood clots, cardiopulmonary complications, morbidity, mortality, among others, and they were willing to proceed.   The patient acknowledged the explanation, agreed to proceed with the plan and consent was signed.   Operative Plan: Right knee arthroscopy, medial and lateral meniscectomy, ACL reconstruction with allograft Discharge Medications: Oxycodone, zofran, Celebrex, tylenol DVT Prophylaxis: ASA 81mg  BID x 6 weks  Physical Therapy: outpatient Special Discharge needs: Gari Crown, PA-C  01/12/2023 11:50 AM

## 2023-01-30 ENCOUNTER — Encounter (HOSPITAL_BASED_OUTPATIENT_CLINIC_OR_DEPARTMENT_OTHER): Payer: Self-pay | Admitting: Orthopaedic Surgery

## 2023-01-30 ENCOUNTER — Other Ambulatory Visit: Payer: Self-pay

## 2023-01-30 NOTE — Progress Notes (Signed)
   01/30/23 1240  Pre-op Phone Call  Surgery Date Verified 02/07/23  Arrival Time Verified (S)   (dr henri office will call pt with this info)  Surgery Location Verified [facility]  Medical History Reviewed Yes  Is the patient taking a GLP-1 receptor agonist? No  Does the patient have diabetes? No diagnosis of diabetes  Do you have a history of heart problems? No  Antiarrhythmic device type  (NA)  Does patient have other implanted devices? No  Patient Teaching Pre / Post Procedure  Patient educated about smoking cessation 24 hours prior to surgery. N/A Non-Smoker  Patient verbalizes understanding of bowel prep? N/A  THA/TKA patients only:  By your surgery date, will you have been taking narcotics for 90 days or greater? No  Med Rec Completed Yes  Take the Following Meds the Morning of Surgery no meds AM of sx; hold nsaid/vit x5d  Recent  Lab Work, EKG, CXR? No  NPO (Including gum & candy) After midnight  Stop Solids, Milk, Candy, and Gum STARTING AT MIDNIGHT  Responsible adult to drive and be with you for 24 hours? Yes  Name & Phone Number for Ride/Caregiver mom (and maybe sister... pt will have this worked out before surgery)  No Jewelry, money, nail polish or make-up.  No lotions, powders, perfumes. No shaving  48 hrs. prior to surgery. Yes  Contacts, Dentures & Glasses Will Have to be Removed Before OR. Yes  Please bring your ID and Insurance Card the morning of your surgery. (Surgery Centers Only) Yes  Bring any papers or x-rays with you that your surgeon gave you. Yes  Instructed to contact the location of procedure/ provider if they or anyone in their household develops symptoms or tests positive for COVID-19, has close contact with someone who tests positive for COVID, or has known exposure to any contagious illness. Yes  Call this number the morning of surgery  with any problems that may cancel your surgery. [phone]  Covid-19 Assessment  Have you had a positive COVID-19 test  within the previous 90 days? No  COVID Testing Guidance Proceed with the additional questions.  Patient's surgery required a COVID-19 test (cardiothoracic, complex ENT, and bronchoscopies/ EBUS) No  Have you been unmasked and in close contact with anyone with COVID-19 or COVID-19 symptoms within the past 10 days? No  Do you or anyone in your household currently have any COVID-19 symptoms? No

## 2023-02-06 NOTE — Discharge Instructions (Addendum)
Ramond Marrow MD, MPH Alfonse Alpers, PA-C [facility] [address], [address] [phone] (tel)   [phone] (fax)   POST-OPERATIVE INSTRUCTIONS - ACL RECONSTRUCTION  WOUND CARE You may remove the Operative Dressing on Post-Op Day #3 (72hrs after surgery).   Leave steri strips in place.   If you feel more comfortable with it you can leave all dressings in place till your 1 week follow-up appointment.   KEEP THE INCISIONS CLEAN AND DRY. An ACE wrap may be used to control swelling, do not wrap this too tight.  If the initial ACE wrap feels too tight or constricting you may loosen it. There may be a small amount of fluid/bleeding leaking at the surgical site.  This is normal; the knee is filled with fluid during the procedure and can leak for 24-48hrs after surgery.  You may change/reinforce the bandage as needed.  Use the Cryocuff, GameReady or Ice as often as possible for the first 3-4 days, then as needed for pain relief. Always keep a towel, ACE wrap or other barrier between the cooling unit and your skin.  You may shower on Post-Op Day #3. Gently pat the area dry.  Do not soak the knee in water.  Do not go swimming in the pool or ocean until 4 weeks after surgery or when otherwise instructed.  BRACE/AMBULATION Your leg will be placed in a brace post-operatively.  You may remove for hygiene only! You will need to wear your brace at all times until we discuss it further.  It should be locked in full extension (0 degrees) if adjustable.   You will be instructed on further bracing after your first visit. Use crutches for comfort but you can put your full weight on the leg as tolerated.  PHYSICAL THERAPY - You will begin physical therapy soon after surgery (unless otherwise specified) - Please call to set up an appointment, if you do not already have one  - Let our office if there are any issues with scheduling your therapy  - You have a physical therapy  appointment scheduled at [facility] (across the hall from our office) on 1/20   REGIONAL ANESTHESIA (NERVE BLOCKS) The anesthesia team may have performed a nerve block for you this is a great tool used to minimize pain.   The block may start wearing off overnight (between 8-24 hours postop) When the block wears off, your pain may go from nearly zero to the pain you would have had postop without the block. This is an abrupt transition but nothing dangerous is happening.   This can be a challenging period but utilize your as needed pain medications to try and manage this period. We suggest you use the pain medication the first night prior to going to bed, to ease this transition.  You may take an extra dose of narcotic when this happens if needed  POST-OP MEDICATIONS- Multimodal approach to pain control In general your pain will be controlled with a combination of substances.  Prescriptions unless otherwise discussed are electronically sent to your pharmacy.  This is a carefully made plan we use to minimize narcotic use.      Celebrex - Anti-inflammatory medication taken on a scheduled basis Acetaminophen - Non-narcotic pain medicine taken on a scheduled basis  Oxycodone - This is a strong narcotic, to be used only on an "as needed" basis for SEVERE pain. Aspirin 81mg  - This medicine is used to minimize the risk of blood clots after surgery.  Zofran - take as needed for nausea   FOLLOW-UP Please call the office to schedule a follow-up appointment for your incision check if you do not already have one, 7-10 days post-operatively. IF YOU HAVE ANY QUESTIONS, PLEASE FEEL FREE TO CALL OUR OFFICE.  HELPFUL INFORMATION   Keep your leg elevated to decrease swelling, which will then in turn decrease your pain. I would elevate the foot of your bed by putting a couple of couch pillows between your mattress and box spring. I would not keep pillow directly under your ankle.  You must wear the brace locked  while sleeping and ambulating until follow-up.   There will be MORE swelling on days 1-3 than there is on the day of surgery.  This also is normal. The swelling will decrease with the anti-inflammatory medication, ice and keeping it elevated. The swelling will make it more difficult to bend your knee. As the swelling goes down your motion will become easier  You may develop swelling and bruising that extends from your knee down to your calf and perhaps even to your foot over the next week. Do not be alarmed. This too is normal, and it is due to gravity  There may be some numbness adjacent to the incision site. This may last for 6-12 months or longer in some patients and is expected.  You may return to sedentary work/school in the next couple of days when you feel up to it. You will need to keep your leg elevated as much as possible   You should wean off your narcotic medicines as soon as you are able.  Most patients will be off narcotics before their first postop appointment.   We suggest you use the pain medication the first night prior to going to bed, in order to ease any pain when the anesthesia wears off. You should avoid taking pain medications on an empty stomach as it will make you nauseous.  Do not drink alcoholic beverages or take illicit drugs when taking pain medications.  It is against the law to drive while taking narcotics. You cannot drive if your Right leg is in brace locked in extension.  Pain medication may make you constipated.  Below are a few solutions to try in this order: Decrease the amount of pain medication if you aren't having pain. Drink lots of decaffeinated fluids. Drink prune juice and/or each dried prunes  If the first 3 don't work start with additional solutions Take Colace - an over-the-counter stool softener Take Senokot - an over-the-counter laxative Take Miralax - a stronger over-the-counter laxative  For more information including helpful videos and  documents visit our website:   [email or web address]-[email or web address]  No Tylenol until 12:40pm today if needed  Post Anesthesia Home Care Instructions  Activity: Get plenty of rest for the remainder of the day. A responsible individual must stay with you for 24 hours following the procedure.  For the next 24 hours, DO NOT: -Drive a car -Advertising copywriter -Drink alcoholic beverages -Take any medication unless instructed by your physician -Make any legal decisions or sign important papers.  Meals: Start with liquid foods such as gelatin or soup. Progress to regular foods as tolerated. Avoid greasy, spicy, heavy foods. If nausea and/or vomiting occur, drink only clear liquids until the nausea and/or vomiting subsides. Call your physician if vomiting continues.  Special Instructions/Symptoms: Your throat may feel dry or sore from the anesthesia or the breathing tube placed in your throat during surgery. If this causes discomfort,  gargle with warm salt water. The discomfort should disappear within 24 hours.  If you had a scopolamine patch placed behind your ear for the management of post- operative nausea and/or vomiting:  1. The medication in the patch is effective for 72 hours, after which it should be removed.  Wrap patch in a tissue and discard in the trash. Wash hands thoroughly with soap and water. 2. You may remove the patch earlier than 72 hours if you experience unpleasant side effects which may include dry mouth, dizziness or visual disturbances. 3. Avoid touching the patch. Wash your hands with soap and water after contact with the patch.    Regional Anesthesia Blocks  1. You may not be able to move or feel the "blocked" extremity after a regional anesthetic block. This may last may last from 3-48 hours after placement, but it will go away. The length of time depends on the medication injected and your individual response to the medication. As the nerves start to  wake up, you may experience tingling as the movement and feeling returns to your extremity. If the numbness and inability to move your extremity has not gone away after 48 hours, please call your surgeon.   2. The extremity that is blocked will need to be protected until the numbness is gone and the strength has returned. Because you cannot feel it, you will need to take extra care to avoid injury. Because it may be weak, you may have difficulty moving it or using it. You may not know what position it is in without looking at it while the block is in effect.  3. For blocks in the legs and feet, returning to weight bearing and walking needs to be done carefully. You will need to wait until the numbness is entirely gone and the strength has returned. You should be able to move your leg and foot normally before you try and bear weight or walk. You will need someone to be with you when you first try to ensure you do not fall and possibly risk injury.  4. Bruising and tenderness at the needle site are common side effects and will resolve in a few days.  5. Persistent numbness or new problems with movement should be communicated to the surgeon or the [facility] [phone] [facility] [phone]).

## 2023-02-07 ENCOUNTER — Ambulatory Visit (HOSPITAL_BASED_OUTPATIENT_CLINIC_OR_DEPARTMENT_OTHER): Payer: 59 | Admitting: Anesthesiology

## 2023-02-07 ENCOUNTER — Encounter (HOSPITAL_BASED_OUTPATIENT_CLINIC_OR_DEPARTMENT_OTHER): Payer: Self-pay | Admitting: Orthopaedic Surgery

## 2023-02-07 ENCOUNTER — Other Ambulatory Visit: Payer: Self-pay

## 2023-02-07 ENCOUNTER — Encounter (HOSPITAL_BASED_OUTPATIENT_CLINIC_OR_DEPARTMENT_OTHER)
Admission: RE | Disposition: A | Discharge status: Home / Self Care | Payer: Self-pay | Source: Home / Self Care | Attending: Orthopaedic Surgery

## 2023-02-07 ENCOUNTER — Ambulatory Visit (HOSPITAL_BASED_OUTPATIENT_CLINIC_OR_DEPARTMENT_OTHER): Payer: 59

## 2023-02-07 ENCOUNTER — Ambulatory Visit (HOSPITAL_BASED_OUTPATIENT_CLINIC_OR_DEPARTMENT_OTHER)
Admission: RE | Admit: 2023-02-07 | Discharge: 2023-02-07 | Disposition: A | Discharge status: Home / Self Care | Payer: 59 | Attending: Orthopaedic Surgery | Admitting: Orthopaedic Surgery

## 2023-02-07 DIAGNOSIS — S83212A Bucket-handle tear of medial meniscus, current injury, left knee, initial encounter: Secondary | ICD-10-CM | POA: Insufficient documentation

## 2023-02-07 DIAGNOSIS — W19XXXA Unspecified fall, initial encounter: Secondary | ICD-10-CM | POA: Insufficient documentation

## 2023-02-07 DIAGNOSIS — Z6841 Body Mass Index (BMI) 40.0 and over, adult: Secondary | ICD-10-CM

## 2023-02-07 DIAGNOSIS — E66813 Obesity, class 3: Secondary | ICD-10-CM | POA: Insufficient documentation

## 2023-02-07 DIAGNOSIS — F17290 Nicotine dependence, other tobacco product, uncomplicated: Secondary | ICD-10-CM | POA: Insufficient documentation

## 2023-02-07 DIAGNOSIS — Z79899 Other long term (current) drug therapy: Secondary | ICD-10-CM | POA: Insufficient documentation

## 2023-02-07 DIAGNOSIS — F419 Anxiety disorder, unspecified: Secondary | ICD-10-CM | POA: Diagnosis not present

## 2023-02-07 DIAGNOSIS — S83272A Complex tear of lateral meniscus, current injury, left knee, initial encounter: Secondary | ICD-10-CM

## 2023-02-07 DIAGNOSIS — K219 Gastro-esophageal reflux disease without esophagitis: Secondary | ICD-10-CM | POA: Diagnosis not present

## 2023-02-07 DIAGNOSIS — G8918 Other acute postprocedural pain: Secondary | ICD-10-CM | POA: Diagnosis not present

## 2023-02-07 DIAGNOSIS — Z419 Encounter for procedure for purposes other than remedying health state, unspecified: Secondary | ICD-10-CM

## 2023-02-07 DIAGNOSIS — S83512A Sprain of anterior cruciate ligament of left knee, initial encounter: Secondary | ICD-10-CM | POA: Insufficient documentation

## 2023-02-07 DIAGNOSIS — S83242A Other tear of medial meniscus, current injury, left knee, initial encounter: Secondary | ICD-10-CM | POA: Diagnosis not present

## 2023-02-07 DIAGNOSIS — S83282A Other tear of lateral meniscus, current injury, left knee, initial encounter: Secondary | ICD-10-CM | POA: Diagnosis not present

## 2023-02-07 HISTORY — PX: KNEE ARTHROSCOPY WITH LATERAL MENISECTOMY: SHX6193

## 2023-02-07 HISTORY — PX: KNEE ARTHROSCOPY WITH MEDIAL MENISECTOMY: SHX5651

## 2023-02-07 LAB — POCT PREGNANCY, URINE: Preg Test, Ur: NEGATIVE

## 2023-02-07 SURGERY — ARTHROSCOPY, KNEE, WITH LATERAL MENISCECTOMY
Anesthesia: General | Site: Knee | Laterality: Left

## 2023-02-07 MED ORDER — FENTANYL CITRATE (PF) 250 MCG/5ML IJ SOLN
INTRAMUSCULAR | Status: DC | PRN
  Administered 2023-02-07 (×4): 50 ug via INTRAVENOUS

## 2023-02-07 MED ORDER — FENTANYL CITRATE (PF) 100 MCG/2ML IJ SOLN
25.0000 ug | INTRAMUSCULAR | Status: DC | PRN
  Administered 2023-02-07: 50 ug via INTRAVENOUS

## 2023-02-07 MED ORDER — FENTANYL CITRATE (PF) 100 MCG/2ML IJ SOLN
INTRAMUSCULAR | Status: AC
  Filled 2023-02-07: qty 2

## 2023-02-07 MED ORDER — CELECOXIB 100 MG PO CAPS: 100.0000 mg | ORAL_CAPSULE | Freq: Two times a day (BID) | ORAL | 0 refills | Status: AC

## 2023-02-07 MED ORDER — SODIUM CHLORIDE 0.9 % IR SOLN
Status: DC | PRN
  Administered 2023-02-07: 6000 mL

## 2023-02-07 MED ORDER — BUPIVACAINE-EPINEPHRINE (PF) 0.5% -1:200000 IJ SOLN
INTRAMUSCULAR | Status: DC | PRN
  Administered 2023-02-07: 15 mL via PERINEURAL

## 2023-02-07 MED ORDER — SODIUM CHLORIDE 0.9 % IV SOLN: INTRAVENOUS | Status: DC | PRN

## 2023-02-07 MED ORDER — SCOPOLAMINE 1 MG/3DAYS TD PT72
MEDICATED_PATCH | TRANSDERMAL | Status: AC
  Filled 2023-02-07: qty 1

## 2023-02-07 MED ORDER — ACETAMINOPHEN 500 MG PO TABS
ORAL_TABLET | ORAL | Status: AC
  Filled 2023-02-07: qty 2

## 2023-02-07 MED ORDER — ACETAMINOPHEN 500 MG PO TABS
1000.0000 mg | ORAL_TABLET | Freq: Once | ORAL | Status: AC
  Administered 2023-02-07: 1000 mg via ORAL

## 2023-02-07 MED ORDER — DEXAMETHASONE SODIUM PHOSPHATE 10 MG/ML IJ SOLN
INTRAMUSCULAR | Status: AC
  Filled 2023-02-07: qty 4

## 2023-02-07 MED ORDER — ASPIRIN 81 MG PO CHEW: 81.0000 mg | CHEWABLE_TABLET | Freq: Two times a day (BID) | ORAL | 0 refills | Status: AC

## 2023-02-07 MED ORDER — OXYCODONE HCL 5 MG PO TABS
ORAL_TABLET | ORAL | Status: AC
  Filled 2023-02-07: qty 1

## 2023-02-07 MED ORDER — SCOPOLAMINE 1 MG/3DAYS TD PT72
1.0000 | MEDICATED_PATCH | Freq: Once | TRANSDERMAL | Status: DC
  Administered 2023-02-07: 1.5 mg via TRANSDERMAL

## 2023-02-07 MED ORDER — OXYCODONE HCL 5 MG PO TABS: ORAL_TABLET | ORAL | 0 refills | Status: AC

## 2023-02-07 MED ORDER — MIDAZOLAM HCL 2 MG/2ML IJ SOLN
2.0000 mg | Freq: Once | INTRAMUSCULAR | Status: AC
  Administered 2023-02-07: 2 mg via INTRAVENOUS

## 2023-02-07 MED ORDER — PROPOFOL 10 MG/ML IV BOLUS
INTRAVENOUS | Status: DC | PRN
  Administered 2023-02-07: 200 mg via INTRAVENOUS

## 2023-02-07 MED ORDER — TRANEXAMIC ACID-NACL 1000-0.7 MG/100ML-% IV SOLN
INTRAVENOUS | Status: AC
  Filled 2023-02-07: qty 100

## 2023-02-07 MED ORDER — DROPERIDOL 2.5 MG/ML IJ SOLN: 0.6250 mg | Freq: Once | INTRAMUSCULAR | Status: DC | PRN

## 2023-02-07 MED ORDER — MIDAZOLAM HCL 2 MG/2ML IJ SOLN
INTRAMUSCULAR | Status: AC
Start: 2023-02-07 — End: ?
  Filled 2023-02-07: qty 2

## 2023-02-07 MED ORDER — LIDOCAINE 2% (20 MG/ML) 5 ML SYRINGE
INTRAMUSCULAR | Status: AC
  Filled 2023-02-07: qty 15

## 2023-02-07 MED ORDER — VANCOMYCIN HCL 1000 MG IV SOLR
INTRAVENOUS | Status: DC | PRN
  Administered 2023-02-07: 1000 mg via TOPICAL

## 2023-02-07 MED ORDER — OXYCODONE HCL 5 MG/5ML PO SOLN: 5.0000 mg | Freq: Once | ORAL | Status: AC | PRN

## 2023-02-07 MED ORDER — LACTATED RINGERS IV SOLN: INTRAVENOUS | Status: DC | PRN

## 2023-02-07 MED ORDER — VANCOMYCIN HCL 1000 MG IV SOLR
INTRAVENOUS | Status: AC
  Filled 2023-02-07: qty 20

## 2023-02-07 MED ORDER — ONDANSETRON HCL 4 MG/2ML IJ SOLN
INTRAMUSCULAR | Status: DC | PRN
  Administered 2023-02-07: 4 mg via INTRAVENOUS

## 2023-02-07 MED ORDER — OXYCODONE HCL 5 MG PO TABS
5.0000 mg | ORAL_TABLET | Freq: Once | ORAL | Status: AC | PRN
  Administered 2023-02-07: 5 mg via ORAL

## 2023-02-07 MED ORDER — CEFAZOLIN SODIUM-DEXTROSE 2-4 GM/100ML-% IV SOLN
2.0000 g | INTRAVENOUS | Status: AC
  Administered 2023-02-07: 2 g via INTRAVENOUS

## 2023-02-07 MED ORDER — ONDANSETRON HCL 4 MG PO TABS: 4.0000 mg | ORAL_TABLET | Freq: Three times a day (TID) | ORAL | 0 refills | Status: AC | PRN

## 2023-02-07 MED ORDER — DEXAMETHASONE SODIUM PHOSPHATE 10 MG/ML IJ SOLN
INTRAMUSCULAR | Status: DC | PRN
  Administered 2023-02-07: 10 mg via INTRAVENOUS

## 2023-02-07 MED ORDER — CLONIDINE HCL (ANALGESIA) 100 MCG/ML EP SOLN
EPIDURAL | Status: DC | PRN
  Administered 2023-02-07: 100 ug

## 2023-02-07 MED ORDER — CEFAZOLIN SODIUM-DEXTROSE 2-4 GM/100ML-% IV SOLN
INTRAVENOUS | Status: AC
  Filled 2023-02-07: qty 100

## 2023-02-07 MED ORDER — ONDANSETRON HCL 4 MG/2ML IJ SOLN
INTRAMUSCULAR | Status: AC
  Filled 2023-02-07: qty 8

## 2023-02-07 MED ORDER — DEXMEDETOMIDINE HCL IN NACL 200 MCG/50ML IV SOLN
INTRAVENOUS | Status: DC | PRN
  Administered 2023-02-07 (×3): 4 ug via INTRAVENOUS

## 2023-02-07 MED ORDER — ACETAMINOPHEN 500 MG PO TABS: 1000.0000 mg | ORAL_TABLET | Freq: Three times a day (TID) | ORAL | 0 refills | Status: AC

## 2023-02-07 MED ORDER — FENTANYL CITRATE (PF) 100 MCG/2ML IJ SOLN
100.0000 ug | Freq: Once | INTRAMUSCULAR | Status: AC
  Administered 2023-02-07: 50 ug via INTRAVENOUS

## 2023-02-07 MED ORDER — LIDOCAINE 2% (20 MG/ML) 5 ML SYRINGE
INTRAMUSCULAR | Status: AC
  Filled 2023-02-07: qty 5

## 2023-02-07 MED ORDER — LIDOCAINE 2% (20 MG/ML) 5 ML SYRINGE
INTRAMUSCULAR | Status: DC | PRN
  Administered 2023-02-07: 100 mg via INTRAVENOUS

## 2023-02-07 MED ORDER — TRANEXAMIC ACID-NACL 1000-0.7 MG/100ML-% IV SOLN
1000.0000 mg | INTRAVENOUS | Status: AC
  Administered 2023-02-07: 1000 mg via INTRAVENOUS

## 2023-02-07 MED ORDER — LACTATED RINGERS IV SOLN: INTRAVENOUS | Status: DC

## 2023-02-07 SURGICAL SUPPLY — 83 items
ANCHOR TIGHTROPE II 20 W/IB (Anchor) IMPLANT
BANDAGE ESMARK 6X9 LF (GAUZE/BANDAGES/DRESSINGS) IMPLANT
BLADE SHAVER BONE 5.0X13 (MISCELLANEOUS) ×1 IMPLANT
BLADE SURG 10 STRL SS (BLADE) ×1 IMPLANT
BLADE SURG 15 STRL LF DISP TIS (BLADE) ×1 IMPLANT
BNDG COHESIVE 4X5 TAN STRL LF (GAUZE/BANDAGES/DRESSINGS) IMPLANT
BNDG ELASTIC 6INX 5YD STR LF (GAUZE/BANDAGES/DRESSINGS) ×1 IMPLANT
BNDG ESMARK 6X9 LF (GAUZE/BANDAGES/DRESSINGS) IMPLANT
BONE TUNNEL PLUG CANNULATED (MISCELLANEOUS) IMPLANT
BURR OVAL 8 FLU 4.0X13 (MISCELLANEOUS) IMPLANT
CHLORAPREP W/TINT 26 (MISCELLANEOUS) ×1 IMPLANT
CLSR STERI-STRIP ANTIMIC 1/2X4 (GAUZE/BANDAGES/DRESSINGS) ×1 IMPLANT
COLLECTOR GRAFT TISSUE (SYSTAGENIX WOUND MANAGEMENT) IMPLANT
COOLER ICEMAN CLASSIC (MISCELLANEOUS) ×1 IMPLANT
COVER BACK TABLE 60X90IN (DRAPES) ×1 IMPLANT
CUFF TRNQT CYL 34X4.125X (TOURNIQUET CUFF) ×1 IMPLANT
CUTTER TENSIONER SUT 2-0 0 FBW (INSTRUMENTS) IMPLANT
DISSECTOR 3.5MM X 13CM CVD (MISCELLANEOUS) IMPLANT
DISSECTOR 4.0MMX13CM CVD (MISCELLANEOUS) ×1 IMPLANT
DRAPE ARTHROSCOPY W/POUCH 90 (DRAPES) ×1 IMPLANT
DRAPE IMP U-DRAPE 54X76 (DRAPES) ×1 IMPLANT
DRAPE OEC MINIVIEW 54X84 (DRAPES) ×1 IMPLANT
DRAPE U-SHAPE 47X51 STRL (DRAPES) ×1 IMPLANT
DRAPE-T ARTHROSCOPY W/POUCH (DRAPES) ×1 IMPLANT
ELECT REM PT RETURN 9FT ADLT (ELECTROSURGICAL) ×1 IMPLANT
ELECTRODE REM PT RTRN 9FT ADLT (ELECTROSURGICAL) ×1 IMPLANT
FIBERSTICK 2 (SUTURE) IMPLANT
GAUZE SPONGE 4X4 12PLY STRL (GAUZE/BANDAGES/DRESSINGS) ×2 IMPLANT
GLOVE BIO SURGEON STRL SZ 6.5 (GLOVE) ×1 IMPLANT
GLOVE BIOGEL M STRL SZ7.5 (GLOVE) IMPLANT
GLOVE BIOGEL PI IND STRL 6.5 (GLOVE) ×1 IMPLANT
GLOVE BIOGEL PI IND STRL 7.0 (GLOVE) IMPLANT
GLOVE BIOGEL PI IND STRL 8 (GLOVE) ×1 IMPLANT
GLOVE ECLIPSE 8.0 STRL XLNG CF (GLOVE) ×2 IMPLANT
GLOVE SURG SYN 8.0 (GLOVE) ×2 IMPLANT
GLOVE SURG SYN 8.0 PF PI (GLOVE) IMPLANT
GOWN STRL REUS W/ TWL LRG LVL3 (GOWN DISPOSABLE) ×2 IMPLANT
GOWN STRL REUS W/ TWL XL LVL3 (GOWN DISPOSABLE) IMPLANT
GOWN STRL REUS W/TWL XL LVL3 (GOWN DISPOSABLE) ×1 IMPLANT
GRAFT TISS ANT TIB TNDN (Tissue) IMPLANT
IMMOBILIZER KNEE 20 (SOFTGOODS) IMPLANT
IMMOBILIZER KNEE 20 THIGH 36 (SOFTGOODS) IMPLANT
IMMOBILIZER KNEE 22 UNIV (SOFTGOODS) IMPLANT
IV NS IRRIG 3000ML ARTHROMATIC (IV SOLUTION) ×4 IMPLANT
KIT TRANSTIBIAL (DISPOSABLE) ×1 IMPLANT
KIT TURNOVER KIT B (KITS) ×1 IMPLANT
MANIFOLD NEPTUNE II (INSTRUMENTS) IMPLANT
NDL SAFETY ECLIPSE 18X1.5 (NEEDLE) ×1 IMPLANT
NDL SUT 2-0 SCORPION KNEE (NEEDLE) IMPLANT
NEEDLE SUT 2-0 SCORPION KNEE (NEEDLE) IMPLANT
NS IRRIG 1000ML POUR BTL (IV SOLUTION) ×1 IMPLANT
PACK ARTHROSCOPY DSU (CUSTOM PROCEDURE TRAY) ×1 IMPLANT
PACK BASIN DAY SURGERY FS (CUSTOM PROCEDURE TRAY) IMPLANT
PAD CAST 4YDX4 CTTN HI CHSV (CAST SUPPLIES) ×1 IMPLANT
PAD COLD SHLDR WRAP-ON (PAD) ×1 IMPLANT
PENCIL SMOKE EVACUATOR (MISCELLANEOUS) ×1 IMPLANT
PIN DRILL ACL TIGHTROPE 4MM (PIN) IMPLANT
PORTAL SKID DEVICE (INSTRUMENTS) IMPLANT
SCREW FT BIOCOMP 10X30 (Screw) IMPLANT
SHEET MEDIUM DRAPE 40X70 STRL (DRAPES) ×1 IMPLANT
SLEEVE SCD COMPRESS KNEE MED (STOCKING) ×1 IMPLANT
SPIKE FLUID TRANSFER (MISCELLANEOUS) IMPLANT
SPONGE T-LAP 18X18 ~~LOC~~+RFID (SPONGE) IMPLANT
SPONGE T-LAP 4X18 ~~LOC~~+RFID (SPONGE) ×1 IMPLANT
SUT 0 FIBERLOOP 38 BLUE TPR ND (SUTURE) IMPLANT
SUT 2 FIBERLOOP 20 STRT BLUE (SUTURE) IMPLANT
SUT FIBERWIRE #2 38 T-5 BLUE (SUTURE) IMPLANT
SUT MNCRL AB 4-0 PS2 18 (SUTURE) ×1 IMPLANT
SUT PDS AB 0 CT 36 (SUTURE) IMPLANT
SUT VIC AB 0 CT1 27XBRD ANBCTR (SUTURE) ×2 IMPLANT
SUT VIC AB 1 CT1 27XBRD ANBCTR (SUTURE) IMPLANT
SUT VIC AB 3-0 SH 27X BRD (SUTURE) ×1 IMPLANT
SUTURE 0 FIBERLP 38 BLU TPR ND (SUTURE) IMPLANT
SUTURE 2 FIBERLOOP 20 STRT BLU (SUTURE) IMPLANT
SUTURE FIBERWR #2 38 T-5 BLUE (SUTURE) IMPLANT
TENDON ANTERIOR TIBIALIS (Tissue) ×1 IMPLANT
TISSUE GRAFT COLLECTOR (SYSTAGENIX WOUND MANAGEMENT) IMPLANT
TOWEL GREEN STERILE FF (TOWEL DISPOSABLE) ×1 IMPLANT
TUBE CONNECTING 20X1/4 (TUBING) ×1 IMPLANT
TUBE SUCTION HIGH CAP CLEAR NV (SUCTIONS) ×1 IMPLANT
TUBING ARTHROSCOPY IRRIG 16FT (MISCELLANEOUS) ×1 IMPLANT
WAND ABLATOR APOLLO I90 (BUR) ×1 IMPLANT
WATER STERILE IRR 1000ML POUR (IV SOLUTION) ×1 IMPLANT

## 2023-02-07 NOTE — Interval H&P Note (Signed)
All questions answered, patient wants to proceed with procedure. ? ?

## 2023-02-07 NOTE — Anesthesia Postprocedure Evaluation (Signed)
Anesthesia Post Note  Patient: Therapist, sports  Procedure(s) Performed: KNEE ARTHROSCOPY WITH LATERAL MENISECTOMY (Left: Knee) KNEE ARTHROSCOPY WITH MEDIAL MENISECTOMY (Left: Knee) KNEE ARTHROSCOPY WITH ANTERIOR CRUCIATE LIGAMENT (ACL) RECONSTRUCTION (Left: Knee)     Patient location during evaluation: PACU Anesthesia Type: General Level of consciousness: awake and alert Pain management: pain level controlled Vital Signs Assessment: post-procedure vital signs reviewed and stable Respiratory status: spontaneous breathing, nonlabored ventilation and respiratory function stable Cardiovascular status: blood pressure returned to baseline Postop Assessment: no apparent nausea or vomiting Anesthetic complications: no   No notable events documented.  Last Vitals:  Vitals:   02/07/23 1230 02/07/23 1245  BP: 114/68 109/76  Pulse: 78 77  Resp: 17 17  Temp:    SpO2: 99% 99%    Last Pain:  Vitals:   02/07/23 1230  TempSrc:   PainSc: 3                  Shanda Howells

## 2023-02-07 NOTE — Anesthesia Procedure Notes (Signed)
Anesthesia Regional Block: Adductor canal block   Pre-Anesthetic Checklist: , timeout performed,  Correct Patient, Correct Site, Correct Laterality,  Correct Procedure, Correct Position, site marked,  Risks and benefits discussed,  Pre-op evaluation,  At surgeon's request and post-op pain management  Laterality: Left  Prep: Maximum Sterile Barrier Precautions used, chloraprep       Needles:  Injection technique: Single-shot  Needle Type: Echogenic Stimulator Needle     Needle Length: 9cm  Needle Gauge: 22     Additional Needles:   Procedures:,,,, ultrasound used (permanent image in chart),,    Narrative:  Start time: 02/07/2023 9:37 AM End time: 02/07/2023 9:40 AM Injection made incrementally with aspirations every 5 mL.  Performed by: Personally  Anesthesiologist: Kaylyn Layer, MD  Additional Notes: Risks, benefits, and alternative discussed. Patient gave consent for procedure. Patient prepped and draped in sterile fashion. Sedation administered, patient remains easily responsive to voice. Relevant anatomy identified with ultrasound guidance. Local anesthetic given in 5cc increments with no signs or symptoms of intravascular injection. No pain or paraesthesias with injection. Patient monitored throughout procedure with signs of LAST or immediate complications. Tolerated well. Ultrasound image placed in chart.  Amalia Greenhouse, MD

## 2023-02-07 NOTE — Anesthesia Procedure Notes (Signed)
Procedure Name: LMA Insertion Date/Time: 02/07/2023 10:30 AM  Performed by: Alvera Novel, CRNAPre-anesthesia Checklist: Patient identified, Emergency Drugs available, Suction available and Patient being monitored Patient Re-evaluated:Patient Re-evaluated prior to induction Oxygen Delivery Method: Circle System Utilized Preoxygenation: Pre-oxygenation with 100% oxygen Induction Type: IV induction Ventilation: Mask ventilation without difficulty LMA: LMA inserted LMA Size: 4.0 Number of attempts: 1 Placement Confirmation: positive ETCO2 Tube secured with: Tape Dental Injury: Teeth and Oropharynx as per pre-operative assessment

## 2023-02-07 NOTE — Anesthesia Preprocedure Evaluation (Addendum)
Anesthesia Evaluation  Patient identified by MRN, date of birth, ID band Patient awake    Reviewed: Allergy & Precautions, NPO status , Patient's Chart, lab work & pertinent test results  History of Anesthesia Complications Negative for: history of anesthetic complications  Airway Mallampati: II  TM Distance: >3 FB Neck ROM: Full    Dental  (+) Chipped,    Pulmonary Patient abstained from smoking., former smoker   Pulmonary exam normal        Cardiovascular negative cardio ROS Normal cardiovascular exam     Neuro/Psych   Anxiety     negative neurological ROS     GI/Hepatic Neg liver ROS,GERD  ,,  Endo/Other    Class 3 obesity  Renal/GU negative Renal ROS  negative genitourinary   Musculoskeletal left knee medial and lateral meniscus tear, ACL tear   Abdominal   Peds  Hematology negative hematology ROS (+)   Anesthesia Other Findings Day of surgery medications reviewed with patient.  Reproductive/Obstetrics                             Anesthesia Physical Anesthesia Plan  ASA: 3  Anesthesia Plan: General   Post-op Pain Management: Tylenol PO (pre-op)* and Regional block*   Induction: Intravenous  PONV Risk Score and Plan: 3 and Treatment may vary due to age or medical condition, Ondansetron, Dexamethasone, Midazolam and Scopolamine patch - Pre-op  Airway Management Planned: LMA  Additional Equipment: None  Intra-op Plan:   Post-operative Plan: Extubation in OR  Informed Consent: I have reviewed the patients History and Physical, chart, labs and discussed the procedure including the risks, benefits and alternatives for the proposed anesthesia with the patient or authorized representative who has indicated his/her understanding and acceptance.     Dental advisory given  Plan Discussed with: CRNA  Anesthesia Plan Comments:        Anesthesia Quick Evaluation

## 2023-02-07 NOTE — Op Note (Signed)
Orthopaedic Surgery Operative Note (CSN: [id number])  Katherine Andrews  [birth year] Date of Surgery: 02/07/2023   Diagnoses:  left knee medial and lateral meniscus tear, ACL tear  Procedure: Left medial lateral partial meniscectomy Left ACL reconstruction with allograft   Operative Finding Patient's cartilage surfaces were relatively preserved.  She had a bucket-handle medial meniscus tear that was degenerative and was bucketed at the time of her injury.  We unfortunate were not able to repair this as the tissue was quite truncated.  70% total medial meniscal volume was resected.  Complex lateral meniscus tear was debrided about 10% total meniscal volume.  ACL was completely ruptured.  Range of motion knee was normal preop and had a positive Lachman and improved at the end of the case.  Successful completion of the planned procedure.    Post-operative plan: The patient will be weightbearing tolerated in the immobilizer.  The patient will be discharged home.  DVT prophylaxis Aspirin 81 mg twice daily for 6 weeks.  Pain control with PRN pain medication preferring oral medicines.  Follow up plan will be scheduled in approximately 7 days for incision check and XR.  Post-Op Diagnosis: Same Surgeons:Primary: Bjorn Pippin, MD Assistants:Angela [facility], Alfonse Alpers, PA-C Location: [facility] OR ROOM 6 Anesthesia: General with adductor Antibiotics: Ancef 2 g Tourniquet time:  Total Tourniquet Time Documented: Thigh (Left) - 45 minutes Total: Thigh (Left) - 45 minutes  Estimated Blood Loss: Minimal Complications: None Specimens: None Implants: Implant Name Type Inv. Item Serial No. Manufacturer Lot No. LRB No. Used Action  TENDON ANTERIOR TIBIALIS - [id number] Tissue TENDON ANTERIOR TIBIALIS [id number] [facility] [id number] Left 1 Implanted  ANCHOR TIGHTROPE II 20 W/IB - [id number] Anchor ANCHOR TIGHTROPE II 20 W/IB  [employer] [id number] Left 1 Implanted  SCREW FT BIOCOMP 10X30 -  [id number] Screw SCREW FT BIOCOMP 10X30  [employer] [id number] Left 1 Implanted    Indications for Surgery:   Katherine Andrews is a 33 y.o. 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  Benefits and risks of operative and nonoperative management were discussed prior to surgery with patient/guardian(s) and informed consent form was completed.  Specific risks including infection, need for additional surgery, rerupture, stiffness, post meniscectomy syndrome amongst others.   Procedure:   The patient was identified properly. Informed consent was obtained and the surgical site was marked. The patient was taken up to suite where general anesthesia was induced. The patient was placed in the supine position with a post against the surgical leg and a nonsterile tourniquet applied. The surgical leg was then prepped and draped usual sterile fashion.  A standard surgical timeout was performed.  2 standard anterior portals were made and diagnostic arthroscopy performed. Please note the findings as noted above.  A tibialis anterior graft was prepared on the back table to a size of 10 mm and 100 mm in length  We began arthroscopy and made our lateral and medial portals in the typical fashion. Fat pad was resected and diagnostic arthroscopy performed with the findings listed above.   We identified the bucket-handle medial meniscus tear and debrided back to stable base as above.  Lateral meniscus was trimmed with a basket and shaver.  The anterior cruciate ligament stump was debrided utilizing a shaver taking great care to preserve the remnant stump on the femur and the tibia for localization of our tunnels. Once the remnant anterior cruciate ligament was removed and we obtained appropriate visualization by performing a small notchplasty  and confirmed that we had indeed identify the over-the-top position. We made small marks at the location of the aperture of the tibial and  femoral tunnels and double checked our location prior to drilling.  Once we identified the appropriate position of the femoral aperture of the ACL we hyperflexed the knee and used a 7 mm offset guide through an anterior medial portal to drill a pin.  We then reamed a 9 mm tunnel by 30 mm in length.  We overreamed from the lateral side with a 4 mm reamer to allow passage of our button.  We shuttled a passing suture.  We then turned our attention to the tibial side.  We cleared the tissue from the tibial aperture using the anterior horn of the lateral meniscus as a guide.  We were able to then use a freehand technique to place a 2 4 pin in the appropriate anatomic position.  We reamed a complete tunnel.  We shuttled our suture for eventual passage.  We began passing the graft through the tibial tunnel in an antegrade fashion.  Femoral fixation was with a tight rope device and we checked its placement on the lateral periosteum with fluoroscopy.  We verified arthroscopically that there is no sign of graft impingement on the notch. We then cycled the knee multiple times and turned our attention to the tibia.  Tibia was fixed with a 10x 30 mm bio composite [employer] screw.  There was good purchase of the screw and the screw protrusion thus we felt there is no need to back up this fixation.  At this point a gentle Lachman maneuver was performed and there is a stable endpoint and no translation.   Incisions closed with absorbable suture. The patient was awoken from general anesthesia and taken to the PACU in stable condition without complication.   Alfonse Alpers, PA-C, present throughout the case, critical for completion in a timely fashion, and for retraction, instrumentation, closure.

## 2023-02-07 NOTE — Progress Notes (Signed)
Assisted Dr. Daiva Huge with left, adductor canal, ultrasound guided block. Side rails up, monitors on throughout procedure. See vital signs in flow sheet. Tolerated Procedure well.

## 2023-02-07 NOTE — Transfer of Care (Addendum)
Immediate Anesthesia Transfer of Care Note  Patient: Therapist, sports  Procedure(s) Performed: KNEE ARTHROSCOPY WITH LATERAL MENISECTOMY (Left: Knee) KNEE ARTHROSCOPY WITH MEDIAL MENISECTOMY (Left: Knee) KNEE ARTHROSCOPY WITH ANTERIOR CRUCIATE LIGAMENT (ACL) RECONSTRUCTION (Left: Knee)  Patient Location: PACU  Anesthesia Type:General  Level of Consciousness: awake and alert   Airway & Oxygen Therapy: Patient Spontanous Breathing and Patient connected to face mask oxygen  Post-op Assessment: Report given to RN and Post -op Vital signs reviewed and stable  Post vital signs: Reviewed and stable  Last Vitals:  Vitals Value Taken Time  BP 130/84 02/07/23 1145  Temp    Pulse 91 02/07/23 1147  Resp 17 02/07/23 1147  SpO2 100 % 02/07/23 1147  Vitals shown include unfiled device data.  Last Pain:  Vitals:   02/07/23 0839  TempSrc: Temporal  PainSc: 5       Patients Stated Pain Goal: 5 (02/07/23 0839)  Complications: Pt emotional and complaining of pain. Fentanyl and precedex given in PACU.

## 2023-02-08 ENCOUNTER — Encounter (HOSPITAL_BASED_OUTPATIENT_CLINIC_OR_DEPARTMENT_OTHER): Payer: Self-pay | Admitting: Orthopaedic Surgery

## 2023-02-11 DIAGNOSIS — R269 Unspecified abnormalities of gait and mobility: Secondary | ICD-10-CM | POA: Diagnosis not present

## 2023-02-11 DIAGNOSIS — M6281 Muscle weakness (generalized): Secondary | ICD-10-CM | POA: Diagnosis not present

## 2023-02-11 DIAGNOSIS — M25662 Stiffness of left knee, not elsewhere classified: Secondary | ICD-10-CM | POA: Diagnosis not present

## 2023-02-11 DIAGNOSIS — S83512D Sprain of anterior cruciate ligament of left knee, subsequent encounter: Secondary | ICD-10-CM | POA: Diagnosis not present

## 2023-02-13 DIAGNOSIS — R269 Unspecified abnormalities of gait and mobility: Secondary | ICD-10-CM | POA: Diagnosis not present

## 2023-02-13 DIAGNOSIS — M25662 Stiffness of left knee, not elsewhere classified: Secondary | ICD-10-CM | POA: Diagnosis not present

## 2023-02-13 DIAGNOSIS — M6281 Muscle weakness (generalized): Secondary | ICD-10-CM | POA: Diagnosis not present

## 2023-02-13 DIAGNOSIS — S83512D Sprain of anterior cruciate ligament of left knee, subsequent encounter: Secondary | ICD-10-CM | POA: Diagnosis not present

## 2023-02-15 DIAGNOSIS — M25562 Pain in left knee: Secondary | ICD-10-CM | POA: Diagnosis not present

## 2023-02-18 DIAGNOSIS — M25662 Stiffness of left knee, not elsewhere classified: Secondary | ICD-10-CM | POA: Diagnosis not present

## 2023-02-18 DIAGNOSIS — M6281 Muscle weakness (generalized): Secondary | ICD-10-CM | POA: Diagnosis not present

## 2023-02-18 DIAGNOSIS — S83512D Sprain of anterior cruciate ligament of left knee, subsequent encounter: Secondary | ICD-10-CM | POA: Diagnosis not present

## 2023-02-18 DIAGNOSIS — R269 Unspecified abnormalities of gait and mobility: Secondary | ICD-10-CM | POA: Diagnosis not present

## 2023-02-20 DIAGNOSIS — M6281 Muscle weakness (generalized): Secondary | ICD-10-CM | POA: Diagnosis not present

## 2023-02-20 DIAGNOSIS — M25662 Stiffness of left knee, not elsewhere classified: Secondary | ICD-10-CM | POA: Diagnosis not present

## 2023-02-20 DIAGNOSIS — S83512D Sprain of anterior cruciate ligament of left knee, subsequent encounter: Secondary | ICD-10-CM | POA: Diagnosis not present

## 2023-02-20 DIAGNOSIS — R269 Unspecified abnormalities of gait and mobility: Secondary | ICD-10-CM | POA: Diagnosis not present
